# Patient Record
Sex: Female | Born: 1985 | Race: Black or African American | Hispanic: No | Marital: Single | Smoking: Never smoker
Health system: Southern US, Community
[De-identification: ages and names within clinical notes are randomized; demographics above are authoritative.]

## PROBLEM LIST (undated history)

## (undated) ENCOUNTER — Emergency Department (HOSPITAL_BASED_OUTPATIENT_CLINIC_OR_DEPARTMENT_OTHER): Admission: EM | Payer: 59 | Source: Home / Self Care

## (undated) DIAGNOSIS — R42 Dizziness and giddiness: Secondary | ICD-10-CM

---

## 2009-04-26 ENCOUNTER — Ambulatory Visit: Payer: Self-pay | Admitting: Radiology

## 2009-04-26 ENCOUNTER — Emergency Department (HOSPITAL_BASED_OUTPATIENT_CLINIC_OR_DEPARTMENT_OTHER): Admission: EM | Admit: 2009-04-26 | Discharge: 2009-04-26 | Payer: Self-pay | Admitting: Emergency Medicine

## 2010-06-20 ENCOUNTER — Emergency Department (HOSPITAL_BASED_OUTPATIENT_CLINIC_OR_DEPARTMENT_OTHER): Admission: EM | Admit: 2010-06-20 | Discharge: 2010-06-20 | Payer: Self-pay | Admitting: Emergency Medicine

## 2010-12-12 LAB — URINALYSIS, ROUTINE W REFLEX MICROSCOPIC
Bilirubin Urine: NEGATIVE
Glucose, UA: NEGATIVE mg/dL
Hgb urine dipstick: NEGATIVE
Specific Gravity, Urine: 1.027 (ref 1.005–1.030)
pH: 6 (ref 5.0–8.0)

## 2011-01-05 LAB — URINE MICROSCOPIC-ADD ON

## 2011-01-05 LAB — CBC
Hemoglobin: 12.8 g/dL (ref 12.0–15.0)
MCHC: 32.4 g/dL (ref 30.0–36.0)
RDW: 13.6 % (ref 11.5–15.5)

## 2011-01-05 LAB — URINALYSIS, ROUTINE W REFLEX MICROSCOPIC
Ketones, ur: 15 mg/dL — AB
Leukocytes, UA: NEGATIVE
Nitrite: NEGATIVE
Specific Gravity, Urine: 1.028 (ref 1.005–1.030)
pH: 5.5 (ref 5.0–8.0)

## 2011-01-05 LAB — DIFFERENTIAL
Basophils Absolute: 0.1 10*3/uL (ref 0.0–0.1)
Basophils Relative: 1 % (ref 0–1)
Eosinophils Relative: 1 % (ref 0–5)
Monocytes Absolute: 0.6 10*3/uL (ref 0.1–1.0)
Neutro Abs: 5.4 10*3/uL (ref 1.7–7.7)

## 2011-01-05 LAB — BASIC METABOLIC PANEL
BUN: 14 mg/dL (ref 6–23)
CO2: 25 mEq/L (ref 19–32)
Calcium: 9.5 mg/dL (ref 8.4–10.5)
Glucose, Bld: 78 mg/dL (ref 70–99)
Sodium: 142 mEq/L (ref 135–145)

## 2011-01-05 LAB — PREGNANCY, URINE: Preg Test, Ur: NEGATIVE

## 2011-09-03 ENCOUNTER — Emergency Department (HOSPITAL_BASED_OUTPATIENT_CLINIC_OR_DEPARTMENT_OTHER)
Admission: EM | Admit: 2011-09-03 | Discharge: 2011-09-03 | Disposition: A | Discharge status: Home / Self Care | Payer: 59 | Attending: Emergency Medicine | Admitting: Emergency Medicine

## 2011-09-03 ENCOUNTER — Encounter: Payer: Self-pay | Admitting: Emergency Medicine

## 2011-09-03 DIAGNOSIS — L299 Pruritus, unspecified: Secondary | ICD-10-CM

## 2011-09-03 MED ORDER — DIPHENHYDRAMINE HCL 50 MG/ML IJ SOLN
25.0000 mg | Freq: Once | INTRAMUSCULAR | Status: AC
  Administered 2011-09-03: 25 mg via INTRAVENOUS
  Filled 2011-09-03: qty 1

## 2011-09-03 MED ORDER — HYDROXYZINE HCL 25 MG PO TABS: 25.0000 mg | ORAL_TABLET | Freq: Four times a day (QID) | ORAL | Status: AC

## 2011-09-03 NOTE — ED Provider Notes (Signed)
History     CSN: [id number] Arrival date & time: 09/03/2011 10:39 PM   First MD Initiated Contact with Patient 09/03/11 2317      Chief Complaint  Patient presents with  . Allergic Reaction   HPI  patient states while she was at work she started developing itching all over. Patient states she does have history of eczema but has not really noticed any particular rash. 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 Patient has not had trouble like this at work before. She denies any shortness of breath or throat swelling. The itching is all over. It is described as moderate to severe. Scratching seems to alleviate it somewhat.   History reviewed. No pertinent past medical history. except eczema  History reviewed. No pertinent past surgical history.  No family history on file.  History  Substance Use Topics  . Smoking status: Never Smoker   . Smokeless tobacco: Not on file  . Alcohol Use: No    OB History    Grav Para Term Preterm Abortions TAB SAB Ect Mult Living                  Review of Systems  All other systems reviewed and are negative.    Allergies  Review of patient's allergies indicates no known allergies.  Home Medications  No current outpatient prescriptions on file.  BP 151/78  Pulse 93  Temp(Src) 97.9 F (36.6 C) (Oral)  Resp 18  SpO2 100%  Physical Exam  Nursing note and vitals reviewed. Constitutional: She appears well-developed and well-nourished. No distress.  HENT:  Head: Normocephalic and atraumatic.  Right Ear: External ear normal.  Left Ear: External ear normal.  Eyes: Conjunctivae are normal. Right eye exhibits no discharge. Left eye exhibits no discharge. No scleral icterus.  Neck: Neck supple. No tracheal deviation present.  Cardiovascular: Normal rate, regular rhythm and intact distal pulses.   Pulmonary/Chest: Effort normal and breath sounds normal.  No stridor. No respiratory distress. She has no wheezes. She has no rales.  Abdominal: Soft. Bowel sounds are normal.  Musculoskeletal: She exhibits no edema and no tenderness.  Neurological: She is alert. She has normal strength. No sensory deficit. Cranial nerve deficit:  no gross defecits noted. She exhibits normal muscle tone. She displays no seizure activity. Coordination normal.  Skin: Skin is warm and dry. No rash noted. No erythema.       Skin somewhat dry  Psychiatric: She has a normal mood and affect.    ED Course  Procedures (including critical care time)  Labs Reviewed - No data to display No results found.  MDM  Patient complains of (but there is no obvious rash. She does not recall getting in contact with any particular substance while at work. This not clear that this is an allergic type reaction. At this point there doesn't appear to be any evidence of an acute emergency medical condition. She'll be given an injection of Benadryl. I will discharge her home with a prescription for hydroxyzine.  Diagnosis #1 pruritus      Celene Kras, MD 09/03/11 2329

## 2011-09-03 NOTE — ED Notes (Signed)
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 No rash noted.

## 2011-11-14 ENCOUNTER — Encounter (HOSPITAL_BASED_OUTPATIENT_CLINIC_OR_DEPARTMENT_OTHER): Payer: Self-pay | Admitting: *Deleted

## 2011-11-14 ENCOUNTER — Emergency Department (HOSPITAL_BASED_OUTPATIENT_CLINIC_OR_DEPARTMENT_OTHER)
Admission: EM | Admit: 2011-11-14 | Discharge: 2011-11-14 | Disposition: A | Discharge status: Home / Self Care | Payer: PRIVATE HEALTH INSURANCE | Attending: Emergency Medicine | Admitting: Emergency Medicine

## 2011-11-14 DIAGNOSIS — R42 Dizziness and giddiness: Secondary | ICD-10-CM | POA: Insufficient documentation

## 2011-11-14 DIAGNOSIS — N390 Urinary tract infection, site not specified: Secondary | ICD-10-CM

## 2011-11-14 DIAGNOSIS — A5901 Trichomonal vulvovaginitis: Secondary | ICD-10-CM | POA: Insufficient documentation

## 2011-11-14 LAB — BASIC METABOLIC PANEL
BUN: 16 mg/dL (ref 6–23)
Calcium: 9.4 mg/dL (ref 8.4–10.5)
GFR calc non Af Amer: >90 mL/min (ref >90)
Glucose, Bld: 90 mg/dL (ref 70–99)

## 2011-11-14 LAB — CBC
MCH: 25.5 pg — ABNORMAL LOW (ref 26.0–34.0)
MCHC: 32 g/dL (ref 30.0–36.0)
Platelets: 297 10*3/uL (ref 150–400)

## 2011-11-14 LAB — URINE MICROSCOPIC-ADD ON

## 2011-11-14 LAB — URINALYSIS, ROUTINE W REFLEX MICROSCOPIC
Bilirubin Urine: NEGATIVE
Ketones, ur: NEGATIVE mg/dL
Nitrite: NEGATIVE
Specific Gravity, Urine: 1.024 (ref 1.005–1.030)
Urobilinogen, UA: 1 mg/dL (ref 0.0–1.0)

## 2011-11-14 MED ORDER — MECLIZINE HCL 25 MG PO TABS
25.0000 mg | ORAL_TABLET | Freq: Once | ORAL | Status: AC
  Administered 2011-11-14: 25 mg via ORAL
  Filled 2011-11-14: qty 1

## 2011-11-14 MED ORDER — MECLIZINE HCL 25 MG PO TABS: 25.0000 mg | ORAL_TABLET | Freq: Three times a day (TID) | ORAL | Status: AC | PRN

## 2011-11-14 MED ORDER — NITROFURANTOIN MONOHYD MACRO 100 MG PO CAPS: 100.0000 mg | ORAL_CAPSULE | Freq: Two times a day (BID) | ORAL | Status: AC

## 2011-11-14 MED ORDER — METRONIDAZOLE 500 MG PO TABS
2000.0000 mg | ORAL_TABLET | Freq: Once | ORAL | Status: AC
  Administered 2011-11-14: 2000 mg via ORAL
  Filled 2011-11-14: qty 4

## 2011-11-14 NOTE — ED Notes (Signed)
Last night while laying down she felt like the room was spinning. She had nausea with the dizziness. Had the same thing happen while at work last week. Woke this am and symptoms were gone but as the day progressed they came back.

## 2011-11-14 NOTE — ED Provider Notes (Signed)
History     CSN: [id number]  Arrival date & time 11/14/11  1631   First MD Initiated Contact with Patient 11/14/11 1706      Chief Complaint  Patient presents with  . Dizziness    (Consider location/radiation/quality/duration/timing/severity/associated sxs/prior treatment) HPI Comments: Pt states that yesterday she had multiple episodes of dizziness as in the room spinning and then in happened again today:pt states that it happens when she turns her head to fast or if she changes positions to fast:pt states that she had some vomiting when the spinning starts:pt denies headache,fever, visual changes, numbness or weakness  The history is provided by the patient. No language interpreter was used.    History reviewed. No pertinent past medical history.  History reviewed. No pertinent past surgical history.  No family history on file.  History  Substance Use Topics  . Smoking status: Never Smoker   . Smokeless tobacco: Not on file  . Alcohol Use: No    OB History    Grav Para Term Preterm Abortions TAB SAB Ect Mult Living                  Review of Systems  All other systems reviewed and are negative.    Allergies  Review of patient's allergies indicates no known allergies.  Home Medications  No current outpatient prescriptions on file.  BP 129/80  Pulse 95  Temp(Src) 98.4 F (36.9 C) (Oral)  Resp 20  SpO2 100%  Physical Exam  Nursing note and vitals reviewed. Constitutional: She is oriented to person, place, and time. She appears well-developed and well-nourished.  HENT:  Head: Normocephalic and atraumatic.  Eyes: Conjunctivae and EOM are normal.  Neck: Neck supple.  Cardiovascular: Normal rate and regular rhythm.   Pulmonary/Chest: Effort normal and breath sounds normal.  Abdominal: Bowel sounds are normal.  Musculoskeletal: Normal range of motion.  Neurological: She is alert and oriented to person, place, and time.  Skin: Skin is warm and dry.    Psychiatric: She has a normal mood and affect.    ED Course  Procedures (including critical care time)  Labs Reviewed  URINALYSIS, ROUTINE W REFLEX MICROSCOPIC - Abnormal; Notable for the following:    APPearance CLOUDY (*)    Leukocytes, UA MODERATE (*)    All other components within normal limits  CBC - Abnormal; Notable for the following:    WBC 12.2 (*)    MCH 25.5 (*)    All other components within normal limits  URINE MICROSCOPIC-ADD ON - Abnormal; Notable for the following:    Squamous Epithelial / LPF MANY (*)    Bacteria, UA MANY (*)    All other components within normal limits  PREGNANCY, URINE  BASIC METABOLIC PANEL   No results found.   1. UTI (lower urinary tract infection)   2. Trichomonal vaginitis   3. Vertigo       MDM  Pt is okay to follow up for std check:pt is feeling better after the meclizine:pt is ambulatory without any problem        Teressa Lower, NP 11/14/11 1837

## 2011-11-14 NOTE — ED Notes (Signed)
Pt. Reports no vomiting, no nausea at present at this time.

## 2011-11-15 NOTE — ED Provider Notes (Signed)
Medical screening examination/treatment/procedure(s) were performed by non-physician practitioner and as supervising physician I was immediately available for consultation/collaboration.   Glynn Octave, MD 11/15/11 [phone]

## 2012-01-11 ENCOUNTER — Emergency Department (HOSPITAL_BASED_OUTPATIENT_CLINIC_OR_DEPARTMENT_OTHER)
Admission: EM | Admit: 2012-01-11 | Discharge: 2012-01-11 | Disposition: A | Discharge status: Home / Self Care | Payer: 59 | Attending: Emergency Medicine | Admitting: Emergency Medicine

## 2012-01-11 ENCOUNTER — Encounter (HOSPITAL_BASED_OUTPATIENT_CLINIC_OR_DEPARTMENT_OTHER): Payer: Self-pay | Admitting: Emergency Medicine

## 2012-01-11 DIAGNOSIS — S39011A Strain of muscle, fascia and tendon of abdomen, initial encounter: Secondary | ICD-10-CM

## 2012-01-11 DIAGNOSIS — R1013 Epigastric pain: Secondary | ICD-10-CM | POA: Insufficient documentation

## 2012-01-11 DIAGNOSIS — L988 Other specified disorders of the skin and subcutaneous tissue: Secondary | ICD-10-CM | POA: Insufficient documentation

## 2012-01-11 DIAGNOSIS — S90829A Blister (nonthermal), unspecified foot, initial encounter: Secondary | ICD-10-CM

## 2012-01-11 MED ORDER — IBUPROFEN 800 MG PO TABS
800.0000 mg | ORAL_TABLET | Freq: Once | ORAL | Status: AC
  Administered 2012-01-11: 800 mg via ORAL
  Filled 2012-01-11: qty 1

## 2012-01-11 MED ORDER — DIPHENHYDRAMINE HCL 25 MG PO CAPS
25.0000 mg | ORAL_CAPSULE | Freq: Once | ORAL | Status: AC
  Administered 2012-01-11: 25 mg via ORAL
  Filled 2012-01-11 (×2): qty 1

## 2012-01-11 NOTE — Discharge Instructions (Signed)
Blisters Blisters are fluid-filled sacs that form within the skin. Common causes of blistering are friction, burns, and exposure to irritating chemicals. The fluid in the blister protects the underlying damaged skin. Most of the time it is not recommended that you open blisters. When a blister is opened, there is an increased chance for infection. Usually, a blister will open on its own. They then dry up and peel off within 10 days. If the blister is tense and uncomfortable (painful) the fluid may be drained. If it is drained the roof of the blister should be left intact. The draining should only be done by a medical professional under aseptic conditions. Poorly fitting shoes and boots can cause blisters by being too tight or too loose. Wearing extra socks or using tape, bandages, or pads over the blister-prone area helps prevent the problem by reducing friction. Blisters heal more slowly if you have diabetes or if you have problems with your circulation. You need to be careful about medical follow-up to prevent infection. HOME CARE INSTRUCTIONS  Protect areas where blisters have formed until the skin is healed. Use a special bandage with a hole cut in the middle around the blister. This reduces pressure and friction. When the blister breaks, trim off the loose skin and keep the area clean by washing it with soap daily. Soaking the blister or broken-open blister with diluted vinegar twice daily for 15 minutes will dry it up and speed the healing. Use 3 tablespoons of white vinegar per quart of water (45 mL white vinegar per liter of water). An antibiotic ointment and a bandage can be used to cover the area after soaking.  SEEK MEDICAL CARE IF:   You develop increased redness, pain, swelling, or drainage in the blistered area.   You develop a pus-like discharge from the blistered area, chills, or a fever.  MAKE SURE YOU:   Understand these instructions.   Will watch your condition.   Will get help  right away if you are not doing well or get worse.  Document Released: 10/23/2004 Document Revised: 09/04/2011 Document Reviewed: 09/20/2008 [facility] Patient Information 2012 [place], [place].Muscle Strain A muscle strain, or pulled muscle, occurs when a muscle is over-stretched. A small number of muscle fibers may also be torn. This is especially common in athletes. This happens when a sudden violent force placed on a muscle pushes it past its capacity. Usually, recovery from a pulled muscle takes 1 to 2 weeks. But complete healing will take 5 to 6 weeks. There are millions of muscle fibers. Following injury, your body will usually return to normal quickly. HOME CARE INSTRUCTIONS   While awake, apply ice to the sore muscle for 15 to 20 minutes each hour for the first 2 days. Put ice in a plastic bag and place a towel between the bag of ice and your skin.   Do not use the pulled muscle for several days. Do not use the muscle if you have pain.   You may wrap the injured area with an elastic bandage for comfort. Be careful not to bind it too tightly. This may interfere with blood circulation.   Only take over-the-counter or prescription medicines for pain, discomfort, or fever as directed by your caregiver. Do not use aspirin as this will increase bleeding (bruising) at injury site.   Warming up before exercise helps prevent muscle strains.  SEEK MEDICAL CARE IF:  There is increased pain or swelling in the affected area. MAKE SURE YOU:  Understand these instructions.   Will watch your condition.   Will get help right away if you are not doing well or get worse.  Document Released: 09/15/2005 Document Revised: 09/04/2011 Document Reviewed: 04/14/2007 [facility] Patient Information 2012 [place], [place].

## 2012-01-11 NOTE — ED Provider Notes (Signed)
History     CSN: [id number]  Arrival date & time 01/11/12  4696   First MD Initiated Contact with Patient 01/11/12 [phone]      Chief Complaint  Patient presents with  . Insect Bite  . Abdominal Pain    (Consider location/radiation/quality/duration/timing/severity/associated sxs/prior treatment) HPI Rash- Patient noted stinging to left foot at work Friday then Saturday morning she noted bumps on top of left foot.  Painless, pruritic, no drainage, no redness.  Patient had epigastric pain beginning yesterday.  Pain is constant tenderness, squeezing.  Taking po without problem, no nausea, vomiting, or fever,  She felt like she might have had some chills yesterday.  No vaginal discharge, no uti symptoms.  Menses regular, lmp 2 weeks ago, normal. No birth control, denies sexual activity for several months.  History reviewed. No pertinent past medical history.  History reviewed. No pertinent past surgical history.  No family history on file.  History  Substance Use Topics  . Smoking status: Never Smoker   . Smokeless tobacco: Not on file  . Alcohol Use: No    OB History    Grav Para Term Preterm Abortions TAB SAB Ect Mult Living                  Review of Systems  All other systems reviewed and are negative.    Allergies  Review of patient's allergies indicates no known allergies.  Home Medications  No current outpatient prescriptions on file.  BP 138/79  Pulse 77  Temp(Src) 98 F (36.7 C) (Oral)  Resp 20  Ht 5\' 8"  (1.727 m)  Wt 230 lb (104.327 kg)  BMI 34.97 kg/m2  SpO2 100%  LMP 12/28/2011  Physical Exam  Nursing note and vitals reviewed. Constitutional: She appears well-developed and well-nourished.  HENT:  Head: Normocephalic and atraumatic.  Eyes: Conjunctivae and EOM are normal. Pupils are equal, round, and reactive to light.  Neck: Normal range of motion. Neck supple.  Cardiovascular: Normal rate, regular rhythm, normal heart sounds and intact distal  pulses.   Pulmonary/Chest: Effort normal and breath sounds normal.  Abdominal: Soft. Bowel sounds are normal. She exhibits distension.       Mild epigastric tenderness with sitting up or movement or palpation.    Musculoskeletal: Normal range of motion.  Neurological: She is alert.  Skin: Skin is warm and dry.       Two bulla top of left foot no surrounding erythem  Psychiatric: She has a normal mood and affect. Thought content normal.    ED Course  Procedures (including critical care time)  Labs Reviewed - No data to display No results found.   No diagnosis found.    MDM  Foot rash. The lie on her feet were cleaned with alcohol. A 22-gauge needle was used to unroofed the area and there is serous fluid and with no evidence of pus noted. Abdominal pain patient has slight tenderness as pain when sitting up or moving. She has had no nausea, vomiting, diarrhea, anorexia, or fever. This seems to be secondary to muscles and the abdominal wall increasing only with flexion of the trunk. She is advised to return if she has any worsening of symptoms or begins having vomiting or fever.      Hilario Quarry, MD 01/11/12 [phone]

## 2012-01-11 NOTE — ED Notes (Signed)
Pt c/o "bite" to top of LT foot x 1 day (2 small blister-like areas noted); also c/o upper middle abd pain x 2 days- denies NVD

## 2012-04-07 ENCOUNTER — Emergency Department (HOSPITAL_BASED_OUTPATIENT_CLINIC_OR_DEPARTMENT_OTHER)
Admission: EM | Admit: 2012-04-07 | Discharge: 2012-04-07 | Disposition: A | Discharge status: Home / Self Care | Payer: 59 | Attending: Emergency Medicine | Admitting: Emergency Medicine

## 2012-04-07 ENCOUNTER — Encounter (HOSPITAL_BASED_OUTPATIENT_CLINIC_OR_DEPARTMENT_OTHER): Payer: Self-pay | Admitting: *Deleted

## 2012-04-07 DIAGNOSIS — R42 Dizziness and giddiness: Secondary | ICD-10-CM | POA: Insufficient documentation

## 2012-04-07 HISTORY — DX: Dizziness and giddiness: R42

## 2012-04-07 MED ORDER — DIAZEPAM 5 MG PO TABS
5.0000 mg | ORAL_TABLET | Freq: Once | ORAL | Status: AC
  Administered 2012-04-07: 5 mg via ORAL
  Filled 2012-04-07: qty 1

## 2012-04-07 MED ORDER — DIAZEPAM 5 MG PO TABS: 5.0000 mg | ORAL_TABLET | Freq: Four times a day (QID) | ORAL | Status: AC | PRN

## 2012-04-07 NOTE — ED Notes (Signed)
Pt reports "vertigo acting up" since this afternoon. C/o dizziness. Took her grandmother's Meclizine PO @ 7pm tonight bc she couldn't find her own pill. Pt reports minimal relief.

## 2012-04-07 NOTE — ED Provider Notes (Signed)
History     CSN: [id number]  Arrival date & time 04/07/12  2145   First MD Initiated Contact with Patient 04/07/12 2331      Chief Complaint  Patient presents with  . Dizziness    (Consider location/radiation/quality/duration/timing/severity/associated sxs/prior treatment) HPI Is a 26 year old black female with a history of vertigo. She had another episode of vertigo beginning this afternoon. The symptoms were moderate at that time with only mild now. She is characterized by a sensation of surrounding spinning. It is worse with movement of her head. She took some meclizine but 7 PM with some improvement. She has had nausea but no vomiting.  Past Medical History  Diagnosis Date  . Vertigo     History reviewed. No pertinent past surgical history.  No family history on file.  History  Substance Use Topics  . Smoking status: Never Smoker   . Smokeless tobacco: Not on file  . Alcohol Use: No    OB History    Grav Para Term Preterm Abortions TAB SAB Ect Mult Living                  Review of Systems  All other systems reviewed and are negative.    Allergies  Review of patient's allergies indicates no known allergies.  Home Medications   Current Outpatient Rx  Name Route Sig Dispense Refill  . MECLIZINE HCL 25 MG PO TABS Oral Take 25 mg by mouth 3 (three) times daily as needed. Patient took this medication for dizziness.      BP 148/78  Pulse 91  Temp 98.4 F (36.9 C) (Oral)  Resp 18  Ht 5\' 7"  (1.702 m)  Wt 230 lb (104.327 kg)  BMI 36.02 kg/m2  SpO2 100%  LMP 04/07/2012  Physical Exam General: Well-developed, well-nourished female in no acute distress; appearance consistent with age of record HENT: normocephalic, atraumatic; TMs normal Eyes: pupils equal round and reactive to light; extraocular muscles intact; no nystagmus Neck: supple Heart: regular rate and rhythm; no murmurs, rubs or gallops Lungs: clear to auscultation bilaterally Abdomen: soft;  nondistended; nontender Extremities: No deformity; full range of motion Neurologic: Awake, alert and oriented; motor function intact in all extremities and symmetric; no facial droop Skin: Warm and dry Psychiatric: Normal mood and affect    ED Course  Procedures (including critical care time)     MDM          Hanley Seamen, MD 04/07/12 2337

## 2012-04-07 NOTE — ED Notes (Signed)
MD at bedside. 

## 2012-07-10 ENCOUNTER — Emergency Department (HOSPITAL_BASED_OUTPATIENT_CLINIC_OR_DEPARTMENT_OTHER)
Admission: EM | Admit: 2012-07-10 | Discharge: 2012-07-10 | Disposition: A | Discharge status: Home / Self Care | Payer: 59 | Attending: Emergency Medicine | Admitting: Emergency Medicine

## 2012-07-10 ENCOUNTER — Encounter (HOSPITAL_BASED_OUTPATIENT_CLINIC_OR_DEPARTMENT_OTHER): Payer: Self-pay | Admitting: *Deleted

## 2012-07-10 ENCOUNTER — Emergency Department (HOSPITAL_BASED_OUTPATIENT_CLINIC_OR_DEPARTMENT_OTHER): Payer: 59

## 2012-07-10 DIAGNOSIS — M542 Cervicalgia: Secondary | ICD-10-CM | POA: Insufficient documentation

## 2012-07-10 DIAGNOSIS — M25579 Pain in unspecified ankle and joints of unspecified foot: Secondary | ICD-10-CM | POA: Insufficient documentation

## 2012-07-10 DIAGNOSIS — T148XXA Other injury of unspecified body region, initial encounter: Secondary | ICD-10-CM

## 2012-07-10 DIAGNOSIS — Y92410 Unspecified street and highway as the place of occurrence of the external cause: Secondary | ICD-10-CM | POA: Insufficient documentation

## 2012-07-10 MED ORDER — CYCLOBENZAPRINE HCL 10 MG PO TABS
10.0000 mg | ORAL_TABLET | Freq: Once | ORAL | Status: AC
  Administered 2012-07-10: 10 mg via ORAL
  Filled 2012-07-10: qty 1

## 2012-07-10 MED ORDER — CYCLOBENZAPRINE HCL 10 MG PO TABS: 10.0000 mg | ORAL_TABLET | Freq: Two times a day (BID) | ORAL | Status: DC | PRN
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
## 2012-09-25 ENCOUNTER — Encounter (HOSPITAL_BASED_OUTPATIENT_CLINIC_OR_DEPARTMENT_OTHER): Payer: Self-pay | Admitting: *Deleted

## 2012-09-25 ENCOUNTER — Emergency Department (HOSPITAL_BASED_OUTPATIENT_CLINIC_OR_DEPARTMENT_OTHER)
Admission: EM | Admit: 2012-09-25 | Discharge: 2012-09-25 | Disposition: A | Discharge status: Home / Self Care | Payer: 59 | Attending: Emergency Medicine | Admitting: Emergency Medicine

## 2012-09-25 DIAGNOSIS — O219 Vomiting of pregnancy, unspecified: Secondary | ICD-10-CM | POA: Insufficient documentation

## 2012-09-25 DIAGNOSIS — Z3201 Encounter for pregnancy test, result positive: Secondary | ICD-10-CM | POA: Insufficient documentation

## 2012-09-25 DIAGNOSIS — R197 Diarrhea, unspecified: Secondary | ICD-10-CM | POA: Insufficient documentation

## 2012-09-25 DIAGNOSIS — Z3490 Encounter for supervision of normal pregnancy, unspecified, unspecified trimester: Secondary | ICD-10-CM

## 2012-09-25 DIAGNOSIS — R1110 Vomiting, unspecified: Secondary | ICD-10-CM

## 2012-09-25 DIAGNOSIS — O9989 Other specified diseases and conditions complicating pregnancy, childbirth and the puerperium: Secondary | ICD-10-CM | POA: Insufficient documentation

## 2012-09-25 LAB — URINALYSIS, ROUTINE W REFLEX MICROSCOPIC
Bilirubin Urine: NEGATIVE
Glucose, UA: NEGATIVE mg/dL
Ketones, ur: NEGATIVE mg/dL
Leukocytes, UA: NEGATIVE
Nitrite: NEGATIVE
Protein, ur: NEGATIVE mg/dL

## 2012-09-25 MED ORDER — PRENATAL 27-0.8 MG PO TABS: 1.0000 | ORAL_TABLET | Freq: Every day | ORAL | Status: DC

## 2012-09-25 MED ORDER — ONDANSETRON HCL 4 MG PO TABS: 4.0000 mg | ORAL_TABLET | Freq: Four times a day (QID) | ORAL | Status: DC

## 2012-09-25 MED ORDER — ONDANSETRON 4 MG PO TBDP
4.0000 mg | ORAL_TABLET | Freq: Once | ORAL | Status: AC
  Administered 2012-09-25: 4 mg via ORAL
  Filled 2012-09-25: qty 1

## 2012-09-25 NOTE — ED Provider Notes (Signed)
History   This chart was scribed for Ethelda Chick, MD scribed by Magnus Sinning. The patient was seen in room MH09/[facility]09 at 18:04   CSN: [id number]  Arrival date & time 09/25/12  1540    Chief Complaint  Patient presents with  . Emesis    (Consider location/radiation/quality/duration/timing/severity/associated sxs/prior treatment) HPI Comments: Claudia Banks is a 26 y.o. female who presents to the Emergency Department complaining of constant moderate emesis with associated nausea onset 2.5 hours ago. Pt notes diarrhea at home before emesis onset. Pt reports 2-3 episodes of emesis today, and she denies abd pain, or vaginal bleeding.   LNMP:08/07/12  G:1 P:0 A:0   Patient is a 26 y.o. female presenting with vomiting. The history is provided by the patient. No language interpreter was used.  Emesis  This is a new problem. The current episode started 3 to 5 hours ago. The problem occurs 2 to 4 times per day. The problem has not changed since onset.The emesis has an appearance of stomach contents. There has been no fever. Associated symptoms include diarrhea. Pertinent negatives include no abdominal pain and no fever.    Past Medical History  Diagnosis Date  . Vertigo     History reviewed. No pertinent past surgical history.  History reviewed. No pertinent family history.  History  Substance Use Topics  . Smoking status: Never Smoker   . Smokeless tobacco: Not on file  . Alcohol Use: No   Review of Systems  Constitutional: Negative for fever.  Gastrointestinal: Positive for nausea, vomiting and diarrhea. Negative for abdominal pain.  All other systems reviewed and are negative.    Allergies  Review of patient's allergies indicates no known allergies.  Home Medications   Current Outpatient Rx  Name  Route  Sig  Dispense  Refill  . CYCLOBENZAPRINE HCL 10 MG PO TABS   Oral   Take 1 tablet (10 mg total) by mouth 2 (two) times daily as needed for muscle spasms.  20 tablet   0   . MECLIZINE HCL 25 MG PO TABS   Oral   Take 25 mg by mouth 3 (three) times daily as needed. Patient took this medication for dizziness.         Marland Kitchen ONDANSETRON HCL 4 MG PO TABS   Oral   Take 1 tablet (4 mg total) by mouth every 6 (six) hours.   12 tablet   0   . PRENATAL 27-0.8 MG PO TABS   Oral   Take 1 tablet by mouth daily.   30 each   0     BP 140/75  Pulse 92  Temp 98.4 F (36.9 C) (Oral)  Resp 16  Ht 5\' 8"  (1.727 m)  Wt 230 lb (104.327 kg)  BMI 34.97 kg/m2  SpO2 100%  LMP 08/07/2012  Physical Exam  Nursing note and vitals reviewed. Constitutional: She is oriented to person, place, and time. She appears well-developed and well-nourished. No distress.  HENT:  Head: Normocephalic and atraumatic.  Eyes: Conjunctivae normal and EOM are normal.  Neck: Neck supple. No tracheal deviation present.  Cardiovascular: Normal rate, regular rhythm and normal heart sounds.   Pulmonary/Chest: Effort normal. No respiratory distress. She has no wheezes. She has no rales.  Abdominal: Soft. Bowel sounds are normal. She exhibits no distension. There is no tenderness.  Musculoskeletal: Normal range of motion. She exhibits no edema.  Neurological: She is alert and oriented to person, place, and time. No sensory deficit.  Skin: Skin  is warm and dry.  Psychiatric: She has a normal mood and affect. Her behavior is normal.    ED Course  Procedures (including critical care time) DIAGNOSTIC STUDIES: Oxygen Saturation is 100% on room air, normal by my interpretation.    COORDINATION OF CARE: 18:06: physical exam performed Labs Reviewed  PREGNANCY, URINE - Abnormal; Notable for the following:    Preg Test, Ur POSITIVE (*)     All other components within normal limits  URINALYSIS, ROUTINE W REFLEX MICROSCOPIC   No results found.   1. Vomiting   2. Pregnancy       MDM  Pt presents with c/o nausea/vomiting this morning.  She was diagnosed with pregnancy in  the ED.  She has no abdominal pain, no vaginal bleeding.  Pt given zofran ODT and tolerated fluid challenge in the ED.  Discharged with strict return precautions.  Pt agreeable with plan.   I personally performed the services described in this documentation, which was scribed in my presence. The recorded information has been reviewed and is accurate.         Ethelda Chick, MD 09/26/12 [phone]

## 2012-09-25 NOTE — ED Notes (Signed)
N/V since this am

## 2015-06-01 ENCOUNTER — Encounter (HOSPITAL_BASED_OUTPATIENT_CLINIC_OR_DEPARTMENT_OTHER): Payer: Self-pay

## 2015-06-01 ENCOUNTER — Emergency Department (HOSPITAL_BASED_OUTPATIENT_CLINIC_OR_DEPARTMENT_OTHER)
Admission: EM | Admit: 2015-06-01 | Discharge: 2015-06-01 | Disposition: A | Discharge status: Home / Self Care | Payer: Medicaid Other | Attending: Emergency Medicine | Admitting: Emergency Medicine

## 2015-06-01 DIAGNOSIS — O9989 Other specified diseases and conditions complicating pregnancy, childbirth and the puerperium: Secondary | ICD-10-CM | POA: Insufficient documentation

## 2015-06-01 DIAGNOSIS — O210 Mild hyperemesis gravidarum: Secondary | ICD-10-CM | POA: Insufficient documentation

## 2015-06-01 DIAGNOSIS — R51 Headache: Secondary | ICD-10-CM | POA: Insufficient documentation

## 2015-06-01 DIAGNOSIS — R519 Headache, unspecified: Secondary | ICD-10-CM

## 2015-06-01 DIAGNOSIS — Z3A01 Less than 8 weeks gestation of pregnancy: Secondary | ICD-10-CM | POA: Diagnosis not present

## 2015-06-01 LAB — URINALYSIS, ROUTINE W REFLEX MICROSCOPIC
Bilirubin Urine: NEGATIVE
GLUCOSE, UA: NEGATIVE mg/dL
HGB URINE DIPSTICK: NEGATIVE
KETONES UR: 15 mg/dL — AB
Nitrite: NEGATIVE
PH: 6.5 (ref 5.0–8.0)
PROTEIN: NEGATIVE mg/dL
Specific Gravity, Urine: 1.027 (ref 1.005–1.030)
Urobilinogen, UA: 1 mg/dL (ref 0.0–1.0)

## 2015-06-01 LAB — PREGNANCY, URINE: Preg Test, Ur: POSITIVE — AB

## 2015-06-01 LAB — URINE MICROSCOPIC-ADD ON

## 2015-06-01 MED ORDER — METOCLOPRAMIDE HCL 10 MG PO TABS: 10.0000 mg | ORAL_TABLET | Freq: Four times a day (QID) | ORAL | Status: DC | PRN

## 2015-06-01 MED ORDER — NITROFURANTOIN MONOHYD MACRO 100 MG PO CAPS: 100.0000 mg | ORAL_CAPSULE | Freq: Two times a day (BID) | ORAL | Status: DC

## 2015-06-01 NOTE — ED Notes (Signed)
HA x 1 week-pt steady gait-NAD

## 2015-06-01 NOTE — ED Provider Notes (Signed)
CSN: [id number]     Arrival date & time 06/01/15  1408 History   First MD Initiated Contact with Patient 06/01/15 1419     Chief Complaint  Patient presents with  . Headache     (Consider location/radiation/quality/duration/timing/severity/associated sxs/prior Treatment) Patient is a 29 y.o. female presenting with headaches.  Headache Pain location:  Generalized Quality:  Dull (pounding) Radiates to:  Does not radiate Onset quality:  Gradual Duration:  1 week Timing:  Constant Chronicity:  Recurrent Similar to prior headaches: yes   Context: bright light   Relieved by:  Nothing Worsened by:  Nothing Associated symptoms: nausea and vomiting   Associated symptoms: no abdominal pain, no numbness and no visual change     Past Medical History  Diagnosis Date  . Vertigo    History reviewed. No pertinent past surgical history. No family history on file. Social History  Substance Use Topics  . Smoking status: Never Smoker   . Smokeless tobacco: None  . Alcohol Use: No   OB History    No data available     Review of Systems  Gastrointestinal: Positive for nausea and vomiting. Negative for abdominal pain.  Neurological: Positive for headaches. Negative for numbness.  All other systems reviewed and are negative.     Allergies  Review of patient's allergies indicates no known allergies.  Home Medications   Prior to Admission medications   Medication Sig Start Date End Date Taking? Authorizing Provider  metoCLOPramide (REGLAN) 10 MG tablet Take 1 tablet (10 mg total) by mouth every 6 (six) hours as needed for nausea (nausea/headache). 06/01/15   Mirian Mo, MD  nitrofurantoin, macrocrystal-monohydrate, (MACROBID) 100 MG capsule Take 1 capsule (100 mg total) by mouth 2 (two) times daily. X 7 days 06/01/15   Mirian Mo, MD   BP 125/65 mmHg  Pulse 93  Temp(Src) 98.6 F (37 C) (Oral)  Resp 18  Ht  (1.727 m)  Wt 245 lb (111.131 kg)  BMI 37.26 kg/m2  SpO2  100%  LMP 04/23/2015 Physical Exam  Constitutional: She is oriented to person, place, and time. She appears well-developed and well-nourished.  HENT:  Head: Normocephalic and atraumatic.  Right Ear: External ear normal.  Left Ear: External ear normal.  Eyes: Conjunctivae and EOM are normal. Pupils are equal, round, and reactive to light.  Neck: Normal range of motion. Neck supple.  Cardiovascular: Normal rate, regular rhythm, normal heart sounds and intact distal pulses.   Pulmonary/Chest: Effort normal and breath sounds normal.  Abdominal: Soft. Bowel sounds are normal. There is no tenderness.  Musculoskeletal: Normal range of motion.  Neurological: She is alert and oriented to person, place, and time. She has normal strength and normal reflexes. No cranial nerve deficit or sensory deficit. Coordination normal. GCS eye subscore is 4. GCS verbal subscore is 5. GCS motor subscore is 6.  Skin: Skin is warm and dry.  Vitals reviewed.   ED Course  Procedures (including critical care time) Labs Review Labs Reviewed  URINALYSIS, ROUTINE W REFLEX MICROSCOPIC (NOT AT [facility]) - Abnormal; Notable for the following:    APPearance TURBID (*)    Ketones, ur 15 (*)    Leukocytes, UA LARGE (*)    All other components within normal limits  PREGNANCY, URINE - Abnormal; Notable for the following:    Preg Test, Ur POSITIVE (*)    All other components within normal limits  URINE MICROSCOPIC-ADD ON - Abnormal; Notable for the following:    Squamous Epithelial /  LPF MANY (*)    Bacteria, UA MANY (*)    All other components within normal limits    Imaging Review No results found. I have personally reviewed and evaluated these images and lab results as part of my medical decision-making.   EKG Interpretation None      MDM   Final diagnoses:  Acute nonintractable headache, unspecified headache type    29 y.o. female with pertinent PMH of migraines presents with recurrent ha.  She states this  is identical to prior migraines.  No neuro deficits.  LMP 2 months ago.  UA checked and positive for pregnancy, bacteriuria.  Discussed results with pt.  No abd pain or other factors concerning for ectopic.  Given prescription for reglan, macrobid.  Ha likely recurrent migraine.  Would treat here but pt is driving.   I have reviewed all laboratory and imaging studies if ordered as above  1. Acute nonintractable headache, unspecified headache type         Mirian Mo, MD 06/01/15 [phone]

## 2015-06-01 NOTE — Discharge Instructions (Signed)
General Headache Without Cause A headache is pain or discomfort felt around the head or neck area. The specific cause of a headache may not be found. There are many causes and types of headaches. A few common ones are:  Tension headaches.  Migraine headaches.  Cluster headaches.  Chronic daily headaches. HOME CARE INSTRUCTIONS   Keep all follow-up appointments with your caregiver or any specialist referral.  Only take over-the-counter or prescription medicines for pain or discomfort as directed by your caregiver.  Lie down in a dark, quiet room when you have a headache.  Keep a headache journal to find out what may trigger your migraine headaches. For example, write down:  What you eat and drink.  How much sleep you get.  Any change to your diet or medicines.  Try massage or other relaxation techniques.  Put ice packs or heat on the head and neck. Use these 3 to 4 times per day for 15 to 20 minutes each time, or as needed.  Limit stress.  Sit up straight, and do not tense your muscles.  Quit smoking if you smoke.  Limit alcohol use.  Decrease the amount of caffeine you drink, or stop drinking caffeine.  Eat and sleep on a regular schedule.  Get 7 to 9 hours of sleep, or as recommended by your caregiver.  Keep lights dim if bright lights bother you and make your headaches worse. SEEK MEDICAL CARE IF:   You have problems with the medicines you were prescribed.  Your medicines are not working.  You have a change from the usual headache.  You have nausea or vomiting. SEEK IMMEDIATE MEDICAL CARE IF:   Your headache becomes severe.  You have a fever.  You have a stiff neck.  You have loss of vision.  You have muscular weakness or loss of muscle control.  You start losing your balance or have trouble walking.  You feel faint or pass out.  You have severe symptoms that are different from your first symptoms. MAKE SURE YOU:   Understand these  instructions.  Will watch your condition.  Will get help right away if you are not doing well or get worse. Document Released: 09/15/2005 Document Revised: 12/08/2011 Document Reviewed: 10/01/2011 [facility] Patient Information 2015 [place], [place]. This information is not intended to replace advice given to you by your health care provider. Make sure you discuss any questions you have with your health care provider. Asymptomatic Bacteriuria Asymptomatic bacteriuria is the presence of a large number of bacteria in your urine without the usual symptoms of burning or frequent urination. The following conditions increase the risk of asymptomatic bacteriuria:  Diabetes mellitus.  Advanced age.  Pregnancy in the first trimester.  Kidney stones.  Kidney transplants.  Leaky kidney tube valve in young children (reflux). Treatment for this condition is not needed in most people and can lead to other problems such as too much yeast and growth of resistant bacteria. However, some people, such as pregnant women, do need treatment to prevent kidney infection. Asymptomatic bacteriuria in pregnancy is also associated with fetal growth restriction, premature labor, and newborn death. HOME CARE INSTRUCTIONS Monitor your condition for any changes. The following actions may help to relieve any discomfort you are feeling:  Drink enough water and fluids to keep your urine clear or pale yellow. Go to the bathroom more often to keep your bladder empty.  Keep the area around your vagina and rectum clean. Wipe yourself from front to back after urinating.  SEEK IMMEDIATE MEDICAL CARE IF:  You develop signs of an infection such as:  Burning with urination.  Frequency of voiding.  Back pain.  Fever.  You have blood in the urine.  You develop a fever. MAKE SURE YOU:  Understand these instructions.  Will watch your condition.  Will get help right away if you are not doing well or get worse. Document  Released: 09/15/2005 Document Revised: 01/30/2014 Document Reviewed: 03/07/2013 [facility] Patient Information 2015 [place], [place]. This information is not intended to replace advice given to you by your health care provider. Make sure you discuss any questions you have with your health care provider.

## 2015-09-18 ENCOUNTER — Encounter (HOSPITAL_BASED_OUTPATIENT_CLINIC_OR_DEPARTMENT_OTHER): Payer: Self-pay

## 2015-09-18 ENCOUNTER — Emergency Department (HOSPITAL_BASED_OUTPATIENT_CLINIC_OR_DEPARTMENT_OTHER)
Admission: EM | Admit: 2015-09-18 | Discharge: 2015-09-18 | Disposition: A | Discharge status: Home / Self Care | Payer: Worker's Compensation | Attending: Emergency Medicine | Admitting: Emergency Medicine

## 2015-09-18 DIAGNOSIS — S0993XA Unspecified injury of face, initial encounter: Secondary | ICD-10-CM | POA: Diagnosis present

## 2015-09-18 DIAGNOSIS — Z79899 Other long term (current) drug therapy: Secondary | ICD-10-CM | POA: Insufficient documentation

## 2015-09-18 DIAGNOSIS — Y9389 Activity, other specified: Secondary | ICD-10-CM | POA: Insufficient documentation

## 2015-09-18 DIAGNOSIS — T148XXA Other injury of unspecified body region, initial encounter: Secondary | ICD-10-CM

## 2015-09-18 DIAGNOSIS — W228XXA Striking against or struck by other objects, initial encounter: Secondary | ICD-10-CM | POA: Diagnosis not present

## 2015-09-18 DIAGNOSIS — Y9289 Other specified places as the place of occurrence of the external cause: Secondary | ICD-10-CM | POA: Diagnosis not present

## 2015-09-18 DIAGNOSIS — S0081XA Abrasion of other part of head, initial encounter: Secondary | ICD-10-CM | POA: Diagnosis not present

## 2015-09-18 DIAGNOSIS — Y990 Civilian activity done for income or pay: Secondary | ICD-10-CM | POA: Insufficient documentation

## 2015-09-18 MED ORDER — ACETAMINOPHEN 500 MG PO TABS
1000.0000 mg | ORAL_TABLET | Freq: Once | ORAL | Status: AC
  Administered 2015-09-18: 1000 mg via ORAL
  Filled 2015-09-18: qty 2

## 2015-09-18 MED ORDER — BACITRACIN ZINC 500 UNIT/GM EX OINT: TOPICAL_OINTMENT | Freq: Two times a day (BID) | CUTANEOUS | Status: DC

## 2015-09-18 MED ORDER — BACITRACIN ZINC 500 UNIT/GM EX OINT: 1.0000 "application " | TOPICAL_OINTMENT | Freq: Two times a day (BID) | CUTANEOUS | Status: DC

## 2015-09-18 NOTE — ED Notes (Signed)
Pt verbalizes understanding of d/c instructions and denies any further needs at this time. 

## 2015-09-18 NOTE — ED Provider Notes (Signed)
CSN: [id number]     Arrival date & time 09/18/15  5621 History   First MD Initiated Contact with Patient 09/18/15 [phone]     Chief Complaint  Patient presents with  . Facial Injury     (Consider location/radiation/quality/duration/timing/severity/associated sxs/prior Treatment) Patient is a 29 y.o. female presenting with facial injury. The history is provided by the patient.  Facial Injury Injury mechanism: corner of a lid scratched right upper cheek  Location:  R cheek Pain details:    Quality:  Aching   Severity:  Moderate   Timing:  Constant   Progression:  Unchanged Chronicity:  New Foreign body present:  No foreign bodies Relieved by:  Nothing Worsened by:  Nothing tried Ineffective treatments:  None tried Associated symptoms: no altered mental status, no double vision, no ear pain, no epistaxis, no headaches, no loss of consciousness, no malocclusion, no neck pain, no rhinorrhea, no trismus and no vomiting   Risk factors: no alcohol use     Past Medical History  Diagnosis Date  . Vertigo    History reviewed. No pertinent past surgical history. History reviewed. No pertinent family history. Social History  Substance Use Topics  . Smoking status: Never Smoker   . Smokeless tobacco: None  . Alcohol Use: No   OB History    No data available     Review of Systems  HENT: Negative for ear pain, nosebleeds and rhinorrhea.   Eyes: Negative for double vision.  Gastrointestinal: Negative for vomiting.  Musculoskeletal: Negative for back pain, neck pain and neck stiffness.  Neurological: Negative for dizziness, seizures, loss of consciousness, facial asymmetry, speech difficulty, weakness, numbness and headaches.  All other systems reviewed and are negative.     Allergies  Review of patient's allergies indicates no known allergies.  Home Medications   Prior to Admission medications   Medication Sig Start Date End Date Taking? Authorizing Provider  metoCLOPramide  (REGLAN) 10 MG tablet Take 1 tablet (10 mg total) by mouth every 6 (six) hours as needed for nausea (nausea/headache). 06/01/15   Mirian Mo, MD  nitrofurantoin, macrocrystal-monohydrate, (MACROBID) 100 MG capsule Take 1 capsule (100 mg total) by mouth 2 (two) times daily. X 7 days 06/01/15   Mirian Mo, MD   BP 148/76 mmHg  Pulse 78  Temp(Src) 97.5 F (36.4 C) (Oral)  Resp 16  Ht  (1.702 m)  Wt 250 lb (113.399 kg)  BMI 39.15 kg/m2  SpO2 100%  LMP 09/10/2015 (Exact Date) Physical Exam  Constitutional: She is oriented to person, place, and time. She appears well-developed and well-nourished. No distress.  HENT:  Head: Normocephalic. Head is without raccoon's eyes and without Battle's sign.    Right Ear: No mastoid tenderness. No hemotympanum.  Left Ear: No mastoid tenderness. No hemotympanum.  Nose: No nasal deformity, septal deviation or nasal septal hematoma.  Mouth/Throat: Oropharynx is clear and moist.  Facial bones stable no swelling bruising or crepitance  Eyes: Conjunctivae and EOM are normal. Pupils are equal, round, and reactive to light.  Neck: Normal range of motion. Neck supple. No tracheal deviation present.  Cardiovascular: Normal rate, regular rhythm and intact distal pulses.   Pulmonary/Chest: Effort normal and breath sounds normal. No respiratory distress. She has no wheezes. She has no rales.  Abdominal: Soft. Bowel sounds are normal. There is no tenderness. There is no rebound and no guarding.  Musculoskeletal: Normal range of motion.       Cervical back: Normal.  Thoracic back: Normal.       Lumbar back: Normal.  Neurological: She is alert and oriented to person, place, and time.    ED Course  Procedures (including critical care time) Labs Review Labs Reviewed - No data to display  Imaging Review No results found. I have personally reviewed and evaluated these images and lab results as part of my medical decision-making.   EKG  Interpretation None      MDM   Final diagnoses:  None     Visual Acuity  Right Eye Distance: 20/70 Left Eye Distance: 20/70 Bilateral Distance: 20/50  Right Eye Near:   Left Eye Near:    Bilateral Near:    Wound care and bacitracin ointment keep clean and dry   Michaela Broski, MD 09/18/15 [phone]

## 2015-09-18 NOTE — ED Notes (Signed)
Pt is sent by work and has authorization for treatment, she states no UDS is necessary.

## 2015-09-18 NOTE — ED Notes (Signed)
Patient states that she thinks she made need glasses.

## 2015-09-18 NOTE — ED Notes (Signed)
Pt closed a lid at work on her face, injuring right cheek bone and has a small 1cm cut that is approximated and not bleeding to outside of eye.  Denies LOC, denies blurred vision, did not get hit in the eye, took ibuprofen prior to arrival, slight redness to area, no gross swelling appreciated.

## 2016-04-30 ENCOUNTER — Emergency Department (HOSPITAL_BASED_OUTPATIENT_CLINIC_OR_DEPARTMENT_OTHER): Payer: Medicaid Other

## 2016-04-30 ENCOUNTER — Encounter (HOSPITAL_BASED_OUTPATIENT_CLINIC_OR_DEPARTMENT_OTHER): Payer: Self-pay | Admitting: *Deleted

## 2016-04-30 ENCOUNTER — Emergency Department (HOSPITAL_BASED_OUTPATIENT_CLINIC_OR_DEPARTMENT_OTHER)
Admission: EM | Admit: 2016-04-30 | Discharge: 2016-04-30 | Disposition: A | Discharge status: Home / Self Care | Payer: Medicaid Other | Attending: Physician Assistant | Admitting: Physician Assistant

## 2016-04-30 DIAGNOSIS — M79673 Pain in unspecified foot: Secondary | ICD-10-CM

## 2016-04-30 DIAGNOSIS — M79671 Pain in right foot: Secondary | ICD-10-CM | POA: Diagnosis not present

## 2016-04-30 DIAGNOSIS — M25571 Pain in right ankle and joints of right foot: Secondary | ICD-10-CM | POA: Diagnosis present

## 2016-04-30 DIAGNOSIS — G8929 Other chronic pain: Secondary | ICD-10-CM | POA: Diagnosis not present

## 2016-04-30 NOTE — ED Triage Notes (Signed)
C/o pain in right ankle pain. 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 Pt just started new job that she is standing a lot and now ankle has been hurting for about 1 week. No obvious injury.

## 2016-04-30 NOTE — ED Provider Notes (Signed)
[facility] Provider Note   CSN: [id number] Arrival date & time: 04/30/16  3382  First Provider Contact:  First MD Initiated Contact with Patient 04/30/16 [phone]        History   Chief Complaint Chief Complaint  Patient presents with  . Ankle Pain    HPI Vitina Rad is a 30 y.o. female.  HPI   Patient's age 30 year old female presenting with 65-year-old injury to her right ankle. Patient stepped off an extra 2 years ago and has had ankle and foot pain since then. Patient reports that her new job she's been standing a lot and has had increased pain. Patient wore wears supportive shoes. She's had no new trauma.  Past Medical History:  Diagnosis Date  . Vertigo     There are no active problems to display for this patient.   History reviewed. No pertinent surgical history.  OB History    No data available       Home Medications    Prior to Admission medications   Not on File    Family History No family history on file.  Social History Social History  Substance Use Topics  . Smoking status: Never Smoker  . Smokeless tobacco: Never Used  . Alcohol use No     Allergies   Review of patient's allergies indicates no known allergies.   Review of Systems Review of Systems  Constitutional: Negative for activity change.  Respiratory: Negative for shortness of breath.   Cardiovascular: Negative for chest pain.  Gastrointestinal: Negative for abdominal pain.  Musculoskeletal: Positive for arthralgias.     Physical Exam Updated Vital Signs BP 142/97 (BP Location: Left Arm)   Pulse 79   Temp 98 F (36.7 C) (Oral)   Resp 18   Ht 5\' 7"  (1.702 m)   Wt 250 lb (113.4 kg)   SpO2 100%   BMI 39.16 kg/m   Physical Exam  Constitutional: She is oriented to person, place, and time. She appears well-developed and well-nourished.  HENT:  Head: Normocephalic and atraumatic.  Eyes: Right eye exhibits no discharge.  Cardiovascular: Normal rate,  regular rhythm and normal heart sounds.   No murmur heard. Pulmonary/Chest: Effort normal and breath sounds normal. She has no wheezes. She has no rales.  Abdominal: She exhibits no distension. There is no tenderness.  Musculoskeletal:  Right ankle appears normal. No abrasions. No swelling. Range of motion is complete.  Neurological: She is oriented to person, place, and time.  Skin: Skin is warm and dry. She is not diaphoretic.  Psychiatric: She has a normal mood and affect.  Nursing note and vitals reviewed.    ED Treatments / Results  Labs (all labs ordered are listed, but only abnormal results are displayed) Labs Reviewed - No data to display  EKG  EKG Interpretation None       Radiology Dg Ankle Complete Right  Result Date: 04/30/2016 CLINICAL DATA:  Right ankle and foot pain 2 weeks with swelling yesterday. No recent injury. EXAM: RIGHT ANKLE - COMPLETE 3+ VIEW COMPARISON:  None. FINDINGS: Mild diffuse soft tissue swelling over the ankle. The ankle mortise is within normal. No acute fracture or dislocation. Small inferior calcaneal spur. IMPRESSION: No acute findings. Electronically Signed   By: Elberta Fortis M.D.   On: 04/30/2016 08:38    Procedures Procedures (including critical care time)  Medications Ordered in ED Medications - No data to display   Initial Impression / Assessment and Plan / ED Course  I have reviewed the triage vital signs and the nursing notes.  Pertinent labs & imaging results that were available during my care of the patient were reviewed by me and considered in my medical decision making (see chart for details).  Clinical Course    Patient here after an injury 2 years ago to her right foot. No swelling or external signs of injury. We'll get x-ray. Differential includes laxity of the tendons from remote injury vs flat feet causing pain.  We will give her an Ace bandage for support. Have her follow up with sports medicine as needed.  Final  Clinical Impressions(s) / ED Diagnoses   Final diagnoses:  None    New Prescriptions New Prescriptions   No medications on file     Courteney Randall An, MD 04/30/16 [phone]

## 2016-04-30 NOTE — Discharge Instructions (Signed)
Follow up with a sports med or PCP for your chronic foot pain. USe ibuprofen to help with symtpoms and wear cushioned foot wear.

## 2017-07-30 ENCOUNTER — Encounter (HOSPITAL_BASED_OUTPATIENT_CLINIC_OR_DEPARTMENT_OTHER): Payer: Self-pay | Admitting: *Deleted

## 2017-07-30 ENCOUNTER — Emergency Department (HOSPITAL_BASED_OUTPATIENT_CLINIC_OR_DEPARTMENT_OTHER)
Admission: EM | Admit: 2017-07-30 | Discharge: 2017-07-30 | Disposition: A | Discharge status: Home / Self Care | Payer: Self-pay | Attending: Emergency Medicine | Admitting: Emergency Medicine

## 2017-07-30 DIAGNOSIS — M545 Low back pain, unspecified: Secondary | ICD-10-CM

## 2017-07-30 LAB — URINALYSIS, ROUTINE W REFLEX MICROSCOPIC
Bilirubin Urine: NEGATIVE
GLUCOSE, UA: NEGATIVE mg/dL
Hgb urine dipstick: NEGATIVE
Ketones, ur: NEGATIVE mg/dL
Nitrite: NEGATIVE
PH: 7 (ref 5.0–8.0)
PROTEIN: NEGATIVE mg/dL
Specific Gravity, Urine: 1.02 (ref 1.005–1.030)

## 2017-07-30 LAB — URINALYSIS, MICROSCOPIC (REFLEX): RBC / HPF: NONE SEEN RBC/hpf (ref 0–5)

## 2017-07-30 MED ORDER — CYCLOBENZAPRINE HCL 10 MG PO TABS: 10.0000 mg | ORAL_TABLET | Freq: Two times a day (BID) | ORAL | 0 refills | Status: AC | PRN

## 2017-07-30 NOTE — ED Provider Notes (Signed)
[facility] EMERGENCY DEPARTMENT Provider Note   CSN: [id number] Arrival date & time: 07/30/17  1943     History   Chief Complaint Chief Complaint  Patient presents with  . Back Pain  . Otalgia    HPI Claudia Banks is a 31 y.o. female.  The history is provided by the patient and medical records. No language interpreter was used.  Back Pain   Pertinent negatives include no chest pain, no fever and no abdominal pain.  Otalgia  Pertinent negatives include no ear discharge, no sore throat, no abdominal pain, no vomiting and no cough.  Claudia Banks is a 31 y.o. female  with a PMH of vertigo who presents to the Emergency Department with two complaints:  1. "whooshing" sound in right ear which sounds like ocean waves intermittently x 2 days. She has had nasal congestion for the last 5-7 days as well. No ear pain or tinnitus. Did not put anything in the ear. No sore throat, neck pain, headache, fever, chills or sinus pressure. No medications or treatments prior to arrival. No alleviating or aggravating factors noted.   2. Intermittent aching right lower back pain. Patient states that this has been going on for several months. Pain will get worse with long work days and improve with rest. Pain also is worse with certain positions. She has tried tylenol and ibuprofen with mild improvement. Patient denies upper back or neck pain. No fever, saddle anesthesia, weakness, numbness, urinary complaints including retention/incontinence. No history of cancer, IVDU, or recent spinal procedures.   Past Medical History:  Diagnosis Date  . Vertigo     There are no active problems to display for this patient.   History reviewed. No pertinent surgical history.  OB History    No data available       Home Medications    Prior to Admission medications   Medication Sig Start Date End Date Taking? Authorizing Provider  cyclobenzaprine (FLEXERIL) 10 MG tablet Take 1 tablet (10  mg total) by mouth 2 (two) times daily as needed for muscle spasms. 07/30/17   Pearce Littlefield, Chase Picket, PA-C    Family History No family history on file.  Social History Social History  Substance Use Topics  . Smoking status: Never Smoker  . Smokeless tobacco: Never Used  . Alcohol use No     Allergies   Patient has no known allergies.   Review of Systems Review of Systems  Constitutional: Negative for chills and fever.  HENT: Positive for congestion. Negative for ear discharge, ear pain, sinus pain, sinus pressure and sore throat.   Respiratory: Negative for cough and shortness of breath.   Cardiovascular: Negative for chest pain.  Gastrointestinal: Negative for abdominal pain, nausea and vomiting.  Genitourinary: Negative for difficulty urinating.  Musculoskeletal: Positive for back pain.     Physical Exam Updated Vital Signs BP 113/74   Pulse 97   Temp 98.4 F (36.9 C) (Oral)   Resp 16   Ht 5\' 8"  (1.727 m)   Wt 113.4 kg (250 lb)   LMP 07/16/2017   SpO2 100%   BMI 38.01 kg/m   Physical Exam  Constitutional: She is oriented to person, place, and time. She appears well-developed and well-nourished.  HENT:  Fluid behind right TM. No erythema. No mastoid tenderness. Left TM normal.  Neck:  No midline or paraspinal tenderness. Full ROM without pain.  Cardiovascular: Normal rate, regular rhythm, normal heart sounds and intact distal pulses.   Pulmonary/Chest: Effort  normal and breath sounds normal. No respiratory distress.  Abdominal: Soft. Bowel sounds are normal. She exhibits no distension. There is no tenderness.  Musculoskeletal:  No midline T/L tenderness. Tenderness to palpation of right lumbar region. 5/5 muscle strength in bilateral LE's. Straight leg raises are negative bilaterally for radicular symptoms. Able to ambulate independently with steady gait.  Neurological: She is alert and oriented to person, place, and time. She has normal reflexes.  Bilateral  lower extremities neurovascularly intact.  Skin: Skin is warm and dry. No rash noted. No erythema.  Nursing note and vitals reviewed.    ED Treatments / Results  Labs (all labs ordered are listed, but only abnormal results are displayed) Labs Reviewed  URINALYSIS, ROUTINE W REFLEX MICROSCOPIC - Abnormal; Notable for the following:       Result Value   APPearance CLOUDY (*)    Leukocytes, UA LARGE (*)    All other components within normal limits  URINALYSIS, MICROSCOPIC (REFLEX) - Abnormal; Notable for the following:    Bacteria, UA FEW (*)    Squamous Epithelial / LPF 6-30 (*)    All other components within normal limits    EKG  EKG Interpretation None       Radiology No results found.  Procedures Procedures (including critical care time)  Medications Ordered in ED Medications - No data to display   Initial Impression / Assessment and Plan / ED Course  I have reviewed the triage vital signs and the nursing notes.  Pertinent labs & imaging results that were available during my care of the patient were reviewed by me and considered in my medical decision making (see chart for details).    Claudia Banks is a 31 y.o. female who presents to ED for two complaints:  1. "whooshing" sound in right ear which sounds like ocean waves intermittently x 2 days. No ear pain. Fluid behind right TM. No signs of infection. She is having nasal congestion as well. Will start on flonase and zytrec. PCP follow up encouraged if symptoms persist.   2. Right-sided low back pain. Patient demonstrates no lower extremity weakness, saddle anesthesia, bowel or bladder incontinence or neuro deficits. No concern for cauda equina. No fevers or other infectious symptoms to suggest that the patient's back pain is due to an infection. Lower extremities are neurovascularly intact and patient is ambulating without difficulty. I have reviewed return precautions, including the development of any of these  signs or symptoms and the patient has voiced understanding. I reviewed symptomatic home care instructions and sports medicine follow-up if symptoms do not improve. RX for Flexeril. Patient voiced understanding and agreement with plan. All questions answered.   Final Clinical Impressions(s) / ED Diagnoses   Final diagnoses:  Right-sided low back pain without sciatica, unspecified chronicity    New Prescriptions Discharge Medication List as of 07/30/2017  9:18 PM    START taking these medications   Details  cyclobenzaprine (FLEXERIL) 10 MG tablet Take 1 tablet (10 mg total) by mouth 2 (two) times daily as needed for muscle spasms., Starting Thu 07/30/2017, Print         Claudia Banks, Chase PicketJaime Pilcher, PA-C 07/31/17 [id number]    Gwyneth SproutPlunkett, Whitney, MD 08/01/17 [id number]

## 2017-07-30 NOTE — ED Notes (Signed)
ED Provider at bedside. 

## 2017-07-30 NOTE — Discharge Instructions (Signed)
It was my pleasure taking care of you today!   Flexeril as needed for muscle strain.  Can also alternate between Tylenol and ibuprofen as needed for additional pain relief.  If symptoms do not improve in 1 week, please call the sports medicine physician listed to schedule a follow-up appointment.  Flonase and Zyrtec or Claritin can help with congestion.   Return to ER for new or worsening symptoms, any additional concerns.

## 2017-07-30 NOTE — ED Triage Notes (Signed)
Lower back pain. Certain movements make the pain worse. She also wants her right ear checked. She has a roaring ocean sound inside her ear.

## 2017-08-09 ENCOUNTER — Other Ambulatory Visit: Payer: Self-pay

## 2017-08-09 ENCOUNTER — Encounter (HOSPITAL_BASED_OUTPATIENT_CLINIC_OR_DEPARTMENT_OTHER): Payer: Self-pay | Admitting: Emergency Medicine

## 2017-08-09 ENCOUNTER — Emergency Department (HOSPITAL_BASED_OUTPATIENT_CLINIC_OR_DEPARTMENT_OTHER)
Admission: EM | Admit: 2017-08-09 | Discharge: 2017-08-09 | Disposition: A | Discharge status: Home / Self Care | Payer: Self-pay | Attending: Emergency Medicine | Admitting: Emergency Medicine

## 2017-08-09 DIAGNOSIS — L02419 Cutaneous abscess of limb, unspecified: Secondary | ICD-10-CM

## 2017-08-09 DIAGNOSIS — Z79899 Other long term (current) drug therapy: Secondary | ICD-10-CM | POA: Insufficient documentation

## 2017-08-09 DIAGNOSIS — L02411 Cutaneous abscess of right axilla: Secondary | ICD-10-CM | POA: Insufficient documentation

## 2017-08-09 MED ORDER — LIDOCAINE-EPINEPHRINE (PF) 2 %-1:200000 IJ SOLN
10.0000 mL | Freq: Once | INTRAMUSCULAR | Status: DC
  Filled 2017-08-09: qty 10

## 2017-08-09 NOTE — ED Provider Notes (Signed)
[facility] EMERGENCY DEPARTMENT Provider Note   CSN: [id number] Arrival date & time: 08/09/17  1524     History   Chief Complaint Chief Complaint  Patient presents with  . Abscess    HPI Claudia Banks is a 31 y.o. female who presents to the ED with complaints of painful/swollen area to R axilla x 4 days. Patient reports she noted a "bump" to her right axilla that has been progressively worsening in size and pain. She has tried applying warm compresses and taken Tylenol and Ibuprofen without relief. Has noted some purulent drainage from the area over the past 24 hours. States she has hx of similar, but less severe and always self limiting within a few days previously. Denies fever, chills, vomiting, numbness, or tingling.   HPI  Past Medical History:  Diagnosis Date  . Vertigo     There are no active problems to display for this patient.   History reviewed. No pertinent surgical history.  OB History    No data available       Home Medications    Prior to Admission medications   Medication Sig Start Date End Date Taking? Authorizing Provider  cyclobenzaprine (FLEXERIL) 10 MG tablet Take 1 tablet (10 mg total) by mouth 2 (two) times daily as needed for muscle spasms. 07/30/17   Ward, Chase PicketJaime Pilcher, PA-C    Family History History reviewed. No pertinent family history.  Social History Social History   Tobacco Use  . Smoking status: Never Smoker  . Smokeless tobacco: Never Used  Substance Use Topics  . Alcohol use: No  . Drug use: No     Allergies   Patient has no known allergies.   Review of Systems Review of Systems  Constitutional: Negative for chills and fever.  HENT: Negative for congestion.   Respiratory: Negative for shortness of breath.   Gastrointestinal: Negative for constipation, diarrhea and vomiting.  Skin:       Painful "bump" to R axilla      Physical Exam Updated Vital Signs BP 137/83 (BP Location: Left Arm)   Pulse  (!) 104   Temp 99.3 F (37.4 C) (Oral)   Resp 20   Ht 5\' 8"  (1.727 m)   Wt 113.4 kg (250 lb)   LMP 07/16/2017   SpO2 100%   BMI 38.01 kg/m   Physical Exam  Constitutional: She appears well-developed and well-nourished. No distress.  HENT:  Head: Normocephalic and atraumatic.  Eyes: Conjunctivae are normal. Right eye exhibits no discharge. Left eye exhibits no discharge.  Cardiovascular: Normal rate and regular rhythm.  Neurological: She is alert.  Clear speech.   Skin: Skin is warm and dry.  R axilla: 2cm diameter area of fluctuance centrally with 1cm area of fluctuance inferiorly at the 4:00 position, inferior area has active purulent drainage. Diffuse induration throughout the axilla. The area is diffusely tender to palpation. 2+ radial pulses, sensation intact distally.   Psychiatric: She has a normal mood and affect. Her behavior is normal. Thought content normal.  Nursing note and vitals reviewed.    ED Treatments / Results   Procedures .Marland Kitchen.Incision and Drainage Date/Time: 08/09/2017 6:14 PM Performed by: Cherly AndersonPetrucelli, Samantha R, PA-C Authorized by: Cherly AndersonPetrucelli, Samantha R, PA-C   Consent:    Consent obtained:  Verbal   Consent given by:  Patient   Risks discussed:  Bleeding, incomplete drainage, pain and infection   Alternatives discussed:  Alternative treatment and no treatment Location:    Type:  Abscess  Location:  Upper extremity Pre-procedure details:    Skin preparation:  Betadine Anesthesia (see MAR for exact dosages):    Anesthesia method:  Local infiltration   Local anesthetic:  Lidocaine 2% WITH epi Procedure details:    Incision types:  Stab incision   Scalpel blade:  11   Wound management:  Probed and deloculated   Drainage:  Purulent   Drainage amount:  Moderate   Wound treatment:  Wound left open   Packing materials:  None Post-procedure details:    Patient tolerance of procedure:  Tolerated well, no immediate complications     Medications  Ordered in ED Medications  lidocaine-EPINEPHrine (XYLOCAINE W/EPI) 2 %-1:200000 (PF) injection 10 mL (not administered)  3mL administered    Initial Impression / Assessment and Plan / ED Course  I have reviewed the triage vital signs and the nursing notes.  Pertinent labs & imaging results that were available during my care of the patient were reviewed by me and considered in my medical decision making (see chart for details).   Patient presents with abscess to R axilla with active drainage. Patient afebrile in the department, no reported fevers at home. I&D performed with purulent drainage. Aftercare instructions provided to patient including warm compresses and ibuprofen/tylenol for pain. Instructed general surgery follow-up given patient has had hx of similar.   Vitals:   08/09/17 1535 08/09/17 1758  BP: 137/83 124/64  Pulse: (!) 104 98  Resp: 20 18  Temp: 99.3 F (37.4 C)   SpO2: 100% 100%    Final Clinical Impressions(s) / ED Diagnoses   Final diagnoses:  Abscess of axilla    ED Discharge Orders    None       Cherly Andersonetrucelli, Samantha R, PA-C 08/09/17 Silva [id number]    Raeford RazorKohut, Stephen, MD 08/11/17 1104

## 2017-08-09 NOTE — Discharge Instructions (Addendum)
Continue to apply warm compresses to the area. Take Ibuprofen and Tylenol as needed for pain. Follow up with general surgery for re-evaluation. Return to the emergency department for new or worsening symptoms such as fever.

## 2017-08-09 NOTE — ED Triage Notes (Signed)
Abscess to R axilla x 3 days.  

## 2017-08-09 NOTE — ED Notes (Signed)
Large fluctuant area to right axilla, one area draining purulent drainage

## 2017-08-09 NOTE — ED Provider Notes (Signed)
5:33 PM patient seen in conjunction with Petrucelli PA-C.   Patient presents with right axillary abscess, likely associated with undiagnosed hidradenitis suppurativa.  Patient has had similar areas in the past that have resolved spontaneously.  Incision and drainage to be performed.  BP 137/83 (BP Location: Left Arm)   Pulse (!) 104   Temp 99.3 F (37.4 C) (Oral)   Resp 20   Ht 5\' 8"  (1.727 m)   Wt 113.4 kg (250 lb)   LMP 07/16/2017   SpO2 100%   BMI 38.01 kg/m     Renne CriglerGeiple, Johnna Bollier, PA-C 08/09/17 1734    Raeford RazorKohut, Stephen, MD 08/11/17 1104

## 2018-07-18 ENCOUNTER — Emergency Department (HOSPITAL_BASED_OUTPATIENT_CLINIC_OR_DEPARTMENT_OTHER)
Admission: EM | Admit: 2018-07-18 | Discharge: 2018-07-18 | Disposition: A | Discharge status: Home / Self Care | Payer: Self-pay | Attending: Emergency Medicine | Admitting: Emergency Medicine

## 2018-07-18 ENCOUNTER — Other Ambulatory Visit: Payer: Self-pay

## 2018-07-18 ENCOUNTER — Encounter (HOSPITAL_BASED_OUTPATIENT_CLINIC_OR_DEPARTMENT_OTHER): Payer: Self-pay | Admitting: Emergency Medicine

## 2018-07-18 DIAGNOSIS — K047 Periapical abscess without sinus: Secondary | ICD-10-CM | POA: Insufficient documentation

## 2018-07-18 MED ORDER — PENICILLIN V POTASSIUM 500 MG PO TABS: 500.0000 mg | ORAL_TABLET | Freq: Four times a day (QID) | ORAL | 0 refills | Status: AC

## 2018-07-18 NOTE — ED Provider Notes (Signed)
[facility] EMERGENCY DEPARTMENT Provider Note   CSN: [id number] Arrival date & time: 07/18/18  1327     History   Chief Complaint Chief Complaint  Patient presents with  . Dental Pain    HPI Claudia Banks is a 32 y.o. female is here for evaluation of dental pain onset on Friday, sudden, gradually worsening.  Pain is to her right bottom gumline, constant, throbbing, severe.  She has tried ibuprofen, Advil, Aleve, Orajel without relief.  Aggravated with palpation and eating and cold fluids.  She denies fevers, chills, trismus, facial swelling, muffled voice.  HPI  Past Medical History:  Diagnosis Date  . Vertigo     There are no active problems to display for this patient.   History reviewed. No pertinent surgical history.   OB History   None      Home Medications    Prior to Admission medications   Medication Sig Start Date End Date Taking? Authorizing Provider  cyclobenzaprine (FLEXERIL) 10 MG tablet Take 1 tablet (10 mg total) by mouth 2 (two) times daily as needed for muscle spasms. 07/30/17   Ward, Chase Picket, PA-C  penicillin v potassium (VEETID) 500 MG tablet Take 1 tablet (500 mg total) by mouth 4 (four) times daily for 7 days. 07/18/18 07/25/18  Liberty Handy, PA-C    Family History No family history on file.  Social History Social History   Tobacco Use  . Smoking status: Never Smoker  . Smokeless tobacco: Never Used  Substance Use Topics  . Alcohol use: No  . Drug use: No     Allergies   Patient has no known allergies.   Review of Systems Review of Systems  HENT: Positive for dental problem.   All other systems reviewed and are negative.    Physical Exam Updated Vital Signs BP (!) 103/52 (BP Location: Left Arm)   Pulse 88   Temp 98.4 F (36.9 C) (Oral)   Resp 16   Ht 5\' 9"  (1.753 m)   Wt 117.9 kg   LMP 06/24/2018   SpO2 100%   BMI 38.40 kg/m   Physical Exam  Constitutional: She is oriented to person,  place, and time. She appears well-developed and well-nourished.  Non-toxic appearance.  HENT:  Head: Normocephalic.  Right Ear: External ear normal.  Left Ear: External ear normal.  Nose: Nose normal.  Mouth/Throat: Abnormal dentition. Dental abscesses and dental caries present.    Gumline at the base of teeth #27, 26, 25 is erythematous, edematous.  There is black/yellow discoloration of the base of these teeth.  Palpation of these areas produces scant amount of purulent discharge and blood.  Poor dentition throughout.  Teeth #1 is cracked and tender with percussion but no surrounding gumline erythema, edema, discharge.  No trismus.  Moist mucous membranes.  Oropharynx and tonsils normal.  No sublingual fullness, normal protrusion of the tongue.  No muffled voice.  No facial swelling.  Eyes: Conjunctivae and EOM are normal.  Neck: Full passive range of motion without pain.  Cardiovascular: Normal rate.  Pulmonary/Chest: Effort normal. No tachypnea. No respiratory distress.  Musculoskeletal: Normal range of motion.  Neurological: She is alert and oriented to person, place, and time.  Skin: Skin is warm and dry. Capillary refill takes less than 2 seconds.  Psychiatric: Her behavior is normal. Thought content normal.     ED Treatments / Results  Labs (all labs ordered are listed, but only abnormal results are displayed) Labs Reviewed - No  data to display  EKG None  Radiology No results found.  Procedures Procedures (including critical care time)  Medications Ordered in ED Medications - No data to display   Initial Impression / Assessment and Plan / ED Course  I have reviewed the triage vital signs and the nursing notes.  Pertinent labs & imaging results that were available during my care of the patient were reviewed by me and considered in my medical decision making (see chart for details).    Dental pain associated with long-standing poor dental health with superimposed  abscess as noted above.  She is afebrile.  No facial swelling.  Tolerating secretions without muffled voice.  Exam is non-concerning for Ludwig angina or other deeper facial or neck infection.  Given that there is obvious abscess, will discharge with penicillin, high-dose NSAIDs and urged follow-up with dentist for ultimate management of dental health and abscess.  I do not think incision and drainage is indicated, purulence is easily obtained with mild compression and I feel like patient can do warm compresses and massage at home.  Strict ED return precautions given. Pt is aware of red flag symptoms that would warrant return to ED for re-evaluation and further treatment. Patient voices understanding and is agreeable to plan.  Final Clinical Impressions(s) / ED Diagnoses   Final diagnoses:  Dental abscess    ED Discharge Orders         Ordered    penicillin v potassium (VEETID) 500 MG tablet  4 times daily     07/18/18 1441           Jerrell Mylar 07/18/18 1444    Linwood Dibbles, MD 07/19/18 2115

## 2018-07-18 NOTE — ED Triage Notes (Signed)
R side dental pain x 2 days.  

## 2018-07-18 NOTE — Discharge Instructions (Signed)
You were seen in the ER for dental pain.  Pain is likely from broken tooth/exposed nerve endings and superimposed infection or collection of pus.  Take 1000 mg of acetaminophen (Tylenol) every 6 hours.  For more pain control take ibuprofen 600 mg every 6 hours.  Use Orajel 15 to 20 minutes prior to eating or drinking to desensitize nerve endings.  You can use desensitizing toothpastes (Sensodyne, Pronamel, [employer] sensitive) throughout the day to help with nerve pain.  Take antibiotic as prescribed. Warm compressions.   Unfortunately, dental pain will continue until you have a full dental evaluation and treatment.  See dental resources attached.  Additionally, I have given you Dr. Mayford Knife and Dr. Leanord Asal contact information, they frequently try to accommodate patients from the Er.  Return to the ER for fevers greater than 100.29F, facial swelling, difficulty opening your jaw despite antibiotics.

## 2020-07-03 ENCOUNTER — Emergency Department (HOSPITAL_BASED_OUTPATIENT_CLINIC_OR_DEPARTMENT_OTHER)
Admission: EM | Admit: 2020-07-03 | Discharge: 2020-07-03 | Disposition: A | Discharge status: Home / Self Care | Payer: BC Managed Care – PPO | Attending: Emergency Medicine | Admitting: Emergency Medicine

## 2020-07-03 ENCOUNTER — Other Ambulatory Visit: Payer: Self-pay

## 2020-07-03 ENCOUNTER — Encounter (HOSPITAL_BASED_OUTPATIENT_CLINIC_OR_DEPARTMENT_OTHER): Payer: Self-pay | Admitting: *Deleted

## 2020-07-03 ENCOUNTER — Emergency Department (HOSPITAL_BASED_OUTPATIENT_CLINIC_OR_DEPARTMENT_OTHER): Payer: BC Managed Care – PPO

## 2020-07-03 DIAGNOSIS — Y93I9 Activity, other involving external motion: Secondary | ICD-10-CM | POA: Diagnosis not present

## 2020-07-03 DIAGNOSIS — Y92410 Unspecified street and highway as the place of occurrence of the external cause: Secondary | ICD-10-CM | POA: Insufficient documentation

## 2020-07-03 DIAGNOSIS — M542 Cervicalgia: Secondary | ICD-10-CM | POA: Insufficient documentation

## 2020-07-03 DIAGNOSIS — M79622 Pain in left upper arm: Secondary | ICD-10-CM | POA: Diagnosis not present

## 2020-07-03 DIAGNOSIS — R519 Headache, unspecified: Secondary | ICD-10-CM | POA: Diagnosis not present

## 2020-07-03 DIAGNOSIS — R079 Chest pain, unspecified: Secondary | ICD-10-CM | POA: Insufficient documentation

## 2020-07-03 MED ORDER — ACETAMINOPHEN 325 MG PO TABS
650.0000 mg | ORAL_TABLET | Freq: Once | ORAL | Status: AC
  Administered 2020-07-03: 650 mg via ORAL
  Filled 2020-07-03: qty 2
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
## 2022-10-04 IMAGING — CR DG CHEST 2V
2 series · 2 of 2 positions shown · non-contrast
Comparison: None.

CLINICAL DATA: Chest pain

EXAM:
CHEST - 2 VIEW

[w chest pa *]
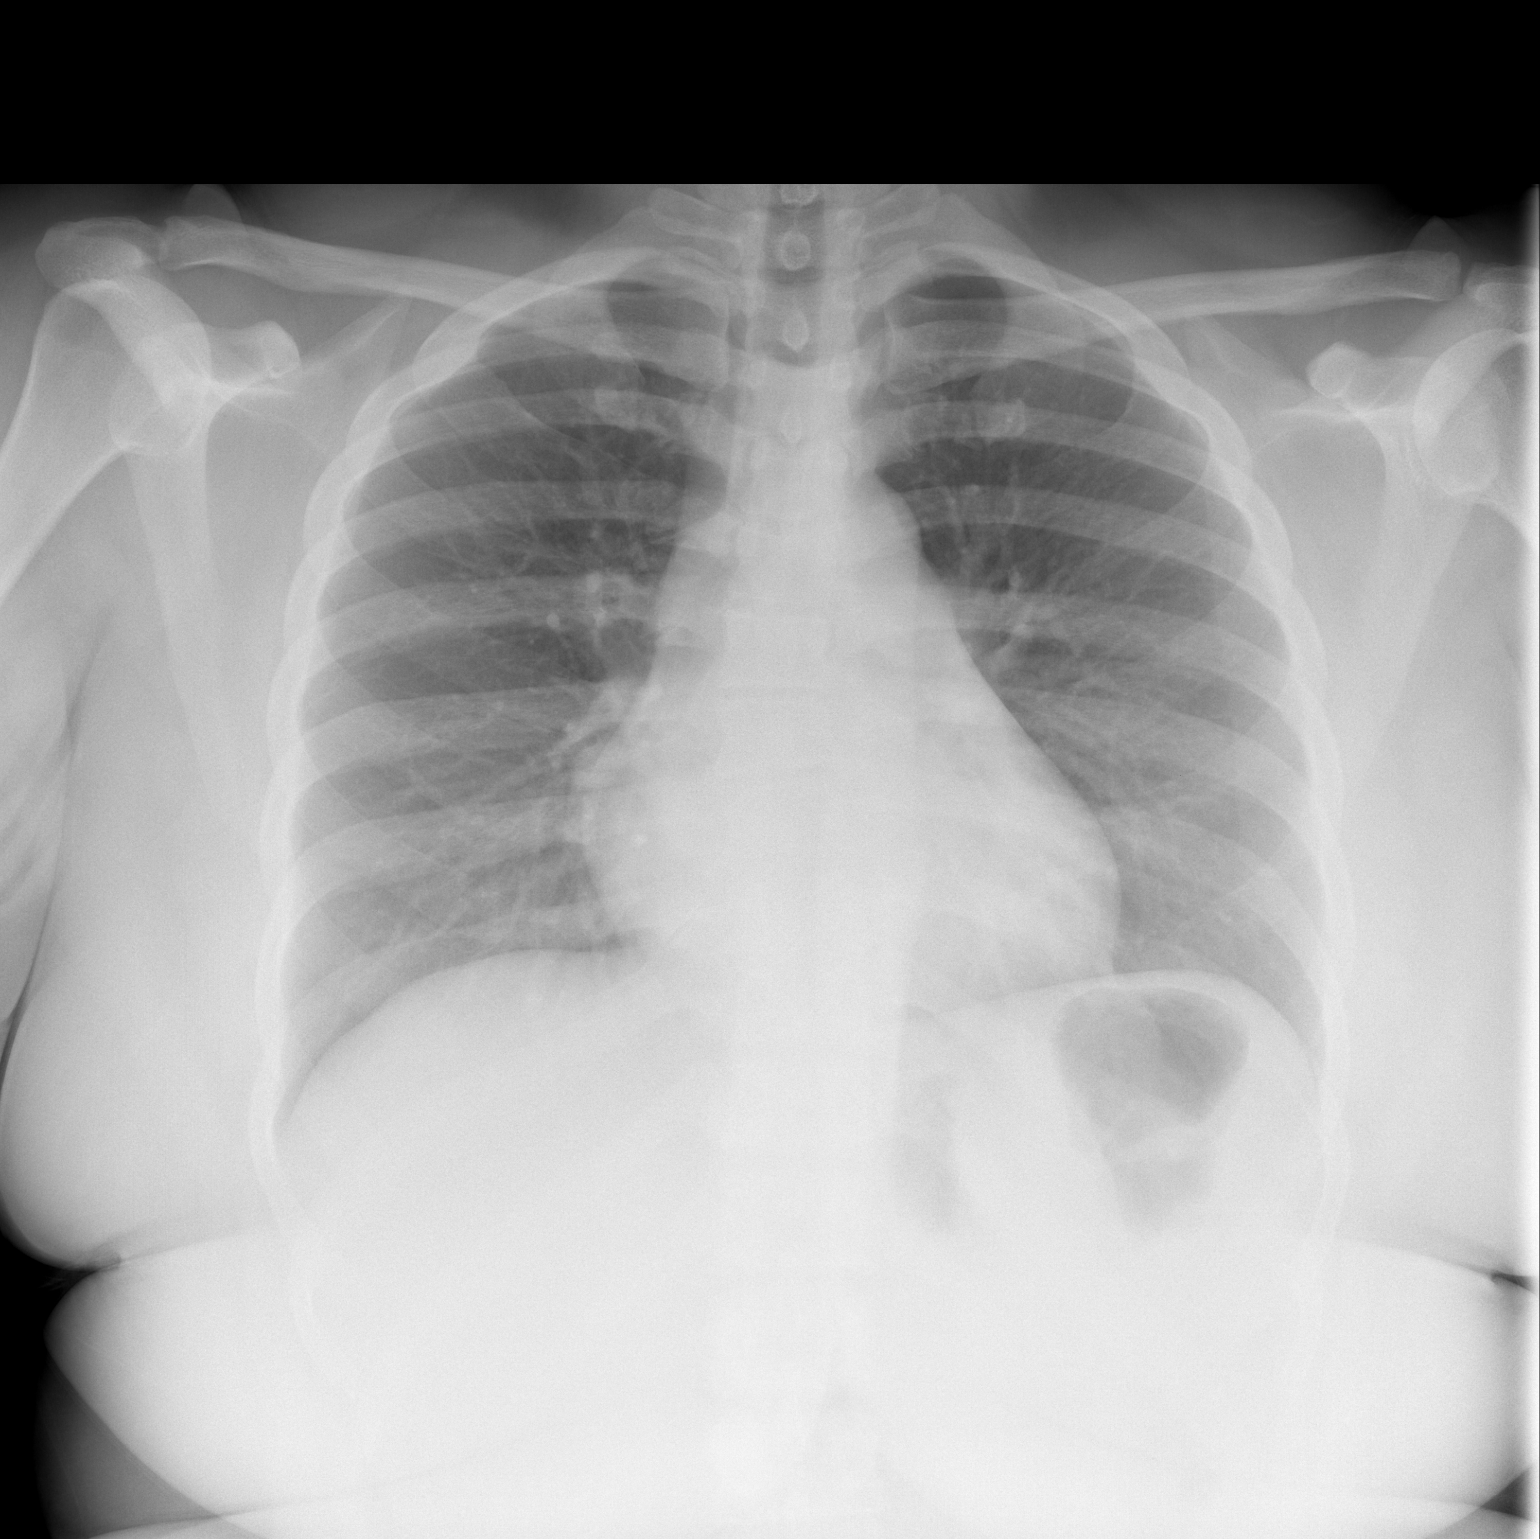

[w chest lat]
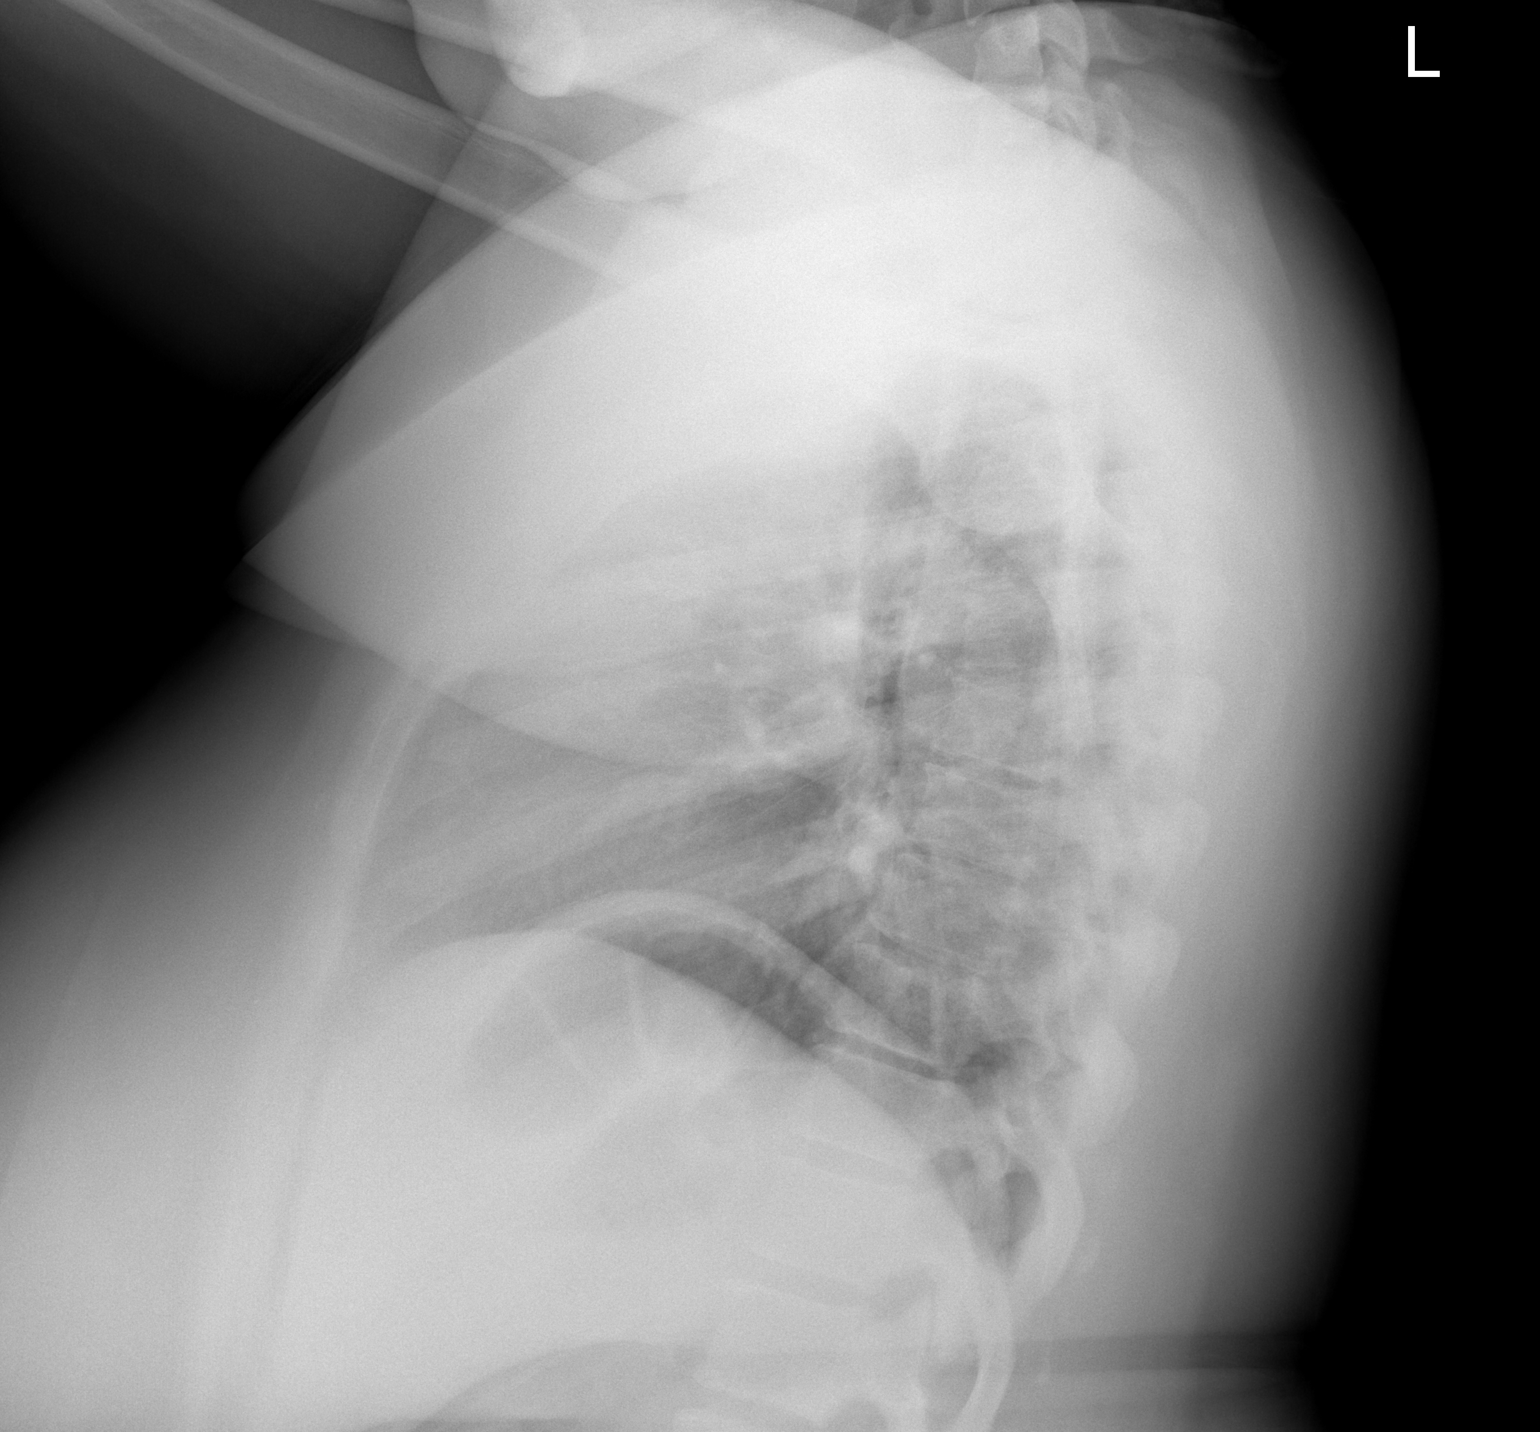

[2 of 2 positions shown; findings below may reference images not displayed]

FINDINGS: The heart size and mediastinal contours are within normal limits.
Both lungs are clear. The visualized skeletal structures are
unremarkable.
IMPRESSION: No active cardiopulmonary disease.

## 2023-01-07 ENCOUNTER — Institutional Professional Consult (permissible substitution): Payer: BC Managed Care – PPO | Admitting: Plastic Surgery

## 2023-03-29 ENCOUNTER — Encounter (HOSPITAL_BASED_OUTPATIENT_CLINIC_OR_DEPARTMENT_OTHER): Payer: Self-pay | Admitting: Emergency Medicine

## 2023-03-29 ENCOUNTER — Other Ambulatory Visit: Payer: Self-pay

## 2023-03-29 ENCOUNTER — Emergency Department (HOSPITAL_BASED_OUTPATIENT_CLINIC_OR_DEPARTMENT_OTHER)
Admission: EM | Admit: 2023-03-29 | Discharge: 2023-03-29 | Discharge status: Against Medical Advice | Payer: Medicaid Other | Attending: Emergency Medicine | Admitting: Emergency Medicine

## 2023-03-29 DIAGNOSIS — Z5321 Procedure and treatment not carried out due to patient leaving prior to being seen by health care provider: Secondary | ICD-10-CM | POA: Insufficient documentation

## 2023-03-29 DIAGNOSIS — R519 Headache, unspecified: Secondary | ICD-10-CM | POA: Diagnosis not present

## 2023-03-29 DIAGNOSIS — H9209 Otalgia, unspecified ear: Secondary | ICD-10-CM | POA: Diagnosis present

## 2023-03-29 NOTE — ED Notes (Signed)
Pt called for room and no answer x2 

## 2023-03-29 NOTE — ED Triage Notes (Signed)
Pt reports swelling on middle of ear for some time. Had drainage last night. Also causing her to have a headache.

## 2023-03-29 NOTE — ED Notes (Signed)
Pt called for room and no answer  

## 2023-05-22 ENCOUNTER — Other Ambulatory Visit: Payer: Self-pay

## 2023-05-22 ENCOUNTER — Other Ambulatory Visit (HOSPITAL_BASED_OUTPATIENT_CLINIC_OR_DEPARTMENT_OTHER): Payer: Self-pay

## 2023-05-22 ENCOUNTER — Emergency Department (HOSPITAL_BASED_OUTPATIENT_CLINIC_OR_DEPARTMENT_OTHER)
Admission: EM | Admit: 2023-05-22 | Discharge: 2023-05-22 | Disposition: A | Discharge status: Home / Self Care | Payer: Medicaid Other | Attending: Emergency Medicine | Admitting: Emergency Medicine

## 2023-05-22 DIAGNOSIS — K029 Dental caries, unspecified: Secondary | ICD-10-CM | POA: Diagnosis not present

## 2023-05-22 DIAGNOSIS — K0889 Other specified disorders of teeth and supporting structures: Secondary | ICD-10-CM

## 2023-05-22 MED ORDER — AMOXICILLIN 500 MG PO CAPS
500.0000 mg | ORAL_CAPSULE | Freq: Three times a day (TID) | ORAL | 0 refills | Status: AC
  Filled 2023-05-22: qty 30, 10d supply, fill #0

## 2023-05-22 NOTE — ED Provider Notes (Signed)
[place] EMERGENCY DEPARTMENT AT [facility] Provider Note   CSN: [id number] Arrival date & time: 05/22/23  9326     History  Chief Complaint  Patient presents with   Dental Pain    Claudia Banks is a 37 y.o. female.  Patient here with right upper dental pain for the last week.  Thinks may be wisdom tooth coming in.  Nothing makes it worse or better.  No fever or chills.  No drooling.  Eating and drinking okay.  The history is provided by the patient.       Home Medications Prior to Admission medications   Medication Sig Start Date End Date Taking? Authorizing Provider  amoxicillin (AMOXIL) 500 MG capsule Take 1 capsule (500 mg total) by mouth 3 (three) times daily for 10 days. 05/22/23 06/01/23 Yes Kerwin Augustus, DO  cyclobenzaprine (FLEXERIL) 10 MG tablet Take 1 tablet (10 mg total) by mouth 2 (two) times daily as needed for muscle spasms. 07/30/17   Ward, Chase Picket, PA-C      Allergies    Patient has no known allergies.    Review of Systems   Review of Systems  Physical Exam Updated Vital Signs  ED Triage Vitals  Encounter Vitals Group     BP 05/22/23 0939 126/81     Systolic BP Percentile --      Diastolic BP Percentile --      Pulse Rate 05/22/23 0937 86     Resp 05/22/23 0937 18     Temp 05/22/23 0937 97.6 F (36.4 C)     Temp Source 05/22/23 0937 Oral     SpO2 05/22/23 0937 98 %     Weight --      Height --      Head Circumference --      Peak Flow --      Pain Score 05/22/23 0938 10     Pain Loc --      Pain Education --      Exclude from Growth Chart --      Physical Exam Vitals and nursing note reviewed.  Constitutional:      General: She is not in acute distress.    Appearance: She is well-developed.  HENT:     Head: Normocephalic and atraumatic.     Mouth/Throat:     Mouth: Mucous membranes are moist.     Comments: Some dental decay to upper molar on right but no trismus or drooling or abscess or facial  swelling  Neurological:     Mental Status: She is alert.     ED Results / Procedures / Treatments   Labs (all labs ordered are listed, but only abnormal results are displayed) Labs Reviewed - No data to display  EKG None  Radiology No results found.  Procedures Procedures    Medications Ordered in ED Medications - No data to display  ED Course/ Medical Decision Making/ A&P                                 Medical Decision Making Risk Prescription drug management.   Claudia Banks here with right upper dental pain.  Looks like a little bit of tooth decay where I suspect wisdom tooth would be coming in.  My suspicion is likely for an impacted wisdom tooth.  Will put her on antibiotics but she has no trismus or drooling.  No fever.  Very well-appearing.  No submandibular swelling.  Will give her information to follow-up with dentistry.  She states she has a dentist that she can follow-up with as well.  Recommend continued use of Tylenol ibuprofen for pain.  Discharged in good condition.  This chart was dictated using voice recognition software.  Despite best efforts to proofread,  errors can occur which can change the documentation meaning.         Final Clinical Impression(s) / ED Diagnoses Final diagnoses:  Pain, dental    Rx / DC Orders ED Discharge Orders          Ordered    amoxicillin (AMOXIL) 500 MG capsule  3 times daily        05/22/23 0943              Virgina Norfolk, DO 05/22/23 [phone]

## 2023-05-22 NOTE — ED Triage Notes (Signed)
Patient presents to ED via POV from home. Here with right upper dental pain x 1 week. Expresses concern for wisdom teeth coming in.

## 2024-08-05 ENCOUNTER — Other Ambulatory Visit: Payer: Self-pay

## 2024-08-05 ENCOUNTER — Emergency Department (HOSPITAL_BASED_OUTPATIENT_CLINIC_OR_DEPARTMENT_OTHER)
Admission: EM | Admit: 2024-08-05 | Discharge: 2024-08-05 | Disposition: A | Discharge status: Home / Self Care | Attending: Emergency Medicine | Admitting: Emergency Medicine

## 2024-08-05 ENCOUNTER — Encounter (HOSPITAL_BASED_OUTPATIENT_CLINIC_OR_DEPARTMENT_OTHER): Payer: Self-pay | Admitting: Emergency Medicine

## 2024-08-05 DIAGNOSIS — R1031 Right lower quadrant pain: Secondary | ICD-10-CM | POA: Diagnosis present

## 2024-08-05 LAB — URINALYSIS, MICROSCOPIC (REFLEX)

## 2024-08-05 LAB — URINALYSIS, ROUTINE W REFLEX MICROSCOPIC
Bilirubin Urine: NEGATIVE
Glucose, UA: NEGATIVE mg/dL
Hgb urine dipstick: NEGATIVE
Ketones, ur: NEGATIVE mg/dL
Nitrite: NEGATIVE
Protein, ur: NEGATIVE mg/dL
Specific Gravity, Urine: 1.02 (ref 1.005–1.030)
pH: 7 (ref 5.0–8.0)

## 2024-08-05 LAB — CBC
HCT: 37.2 % (ref 36.0–46.0)
Hemoglobin: 11.5 g/dL — ABNORMAL LOW (ref 12.0–15.0)
MCH: 24.1 pg — ABNORMAL LOW (ref 26.0–34.0)
MCHC: 30.9 g/dL (ref 30.0–36.0)
MCV: 78 fL — ABNORMAL LOW (ref 80.0–100.0)
Platelets: 376 K/uL (ref 150–400)
RBC: 4.77 MIL/uL (ref 3.87–5.11)
RDW: 15.6 % — ABNORMAL HIGH (ref 11.5–15.5)
WBC: 9.9 K/uL (ref 4.0–10.5)
nRBC: 0 % (ref 0.0–0.2)

## 2024-08-05 LAB — WET PREP, GENITAL
Sperm: NONE SEEN
Trich, Wet Prep: NONE SEEN
WBC, Wet Prep HPF POC: <10 (ref <10)
Yeast Wet Prep HPF POC: NONE SEEN

## 2024-08-05 LAB — COMPREHENSIVE METABOLIC PANEL WITH GFR
ALT: 11 U/L (ref 0–44)
AST: 21 U/L (ref 15–41)
Albumin: 4.1 g/dL (ref 3.5–5.0)
Alkaline Phosphatase: 66 U/L (ref 38–126)
Anion gap: 11 (ref 5–15)
BUN: 12 mg/dL (ref 6–20)
CO2: 23 mmol/L (ref 22–32)
Calcium: 9.1 mg/dL (ref 8.9–10.3)
Chloride: 102 mmol/L (ref 98–111)
Creatinine, Ser: 0.71 mg/dL (ref 0.44–1.00)
GFR, Estimated: >60 mL/min (ref >60)
Glucose, Bld: 83 mg/dL (ref 70–99)
Potassium: 3.8 mmol/L (ref 3.5–5.1)
Sodium: 136 mmol/L (ref 135–145)
Total Bilirubin: 0.3 mg/dL (ref 0.0–1.2)
Total Protein: 7.9 g/dL (ref 6.5–8.1)

## 2024-08-05 LAB — PREGNANCY, URINE: Preg Test, Ur: NEGATIVE

## 2024-08-05 LAB — LIPASE, BLOOD: Lipase: 14 U/L (ref 11–51)

## 2024-08-05 NOTE — ED Triage Notes (Signed)
 RLQ pain radiating to groin area , nausea and intermittent diarrhea x 5 day . Denies urinary symptoms .

## 2024-08-05 NOTE — ED Provider Notes (Signed)
 [place] [facility] AT [facility] Provider Note   CSN: [id number] Arrival date & time: 08/05/24  9155     Patient presents with: Abdominal Pain (RLQ)   Claudia Banks is a 38 y.o. female.   The history is provided by the patient and medical records. No language interpreter was used.  Abdominal Pain    37 year old female presenting with complaint of abdominal pain.  Patient states for the past 5 days she has had intermittent pain to her right lower abdomen.  Pain is sharp achy happen intermittently with occasional bouts of nausea and some loose stool.  Pain seems to be improved with over-the-counter medication such as Tylenol  and ibuprofen .  No report of any fever or chills no chest pain shortness of breath productive cough no urinary discomfort no hematuria no vaginal bleeding or vaginal discharge.  Last menstrual period was 07/08/2024.  Currently rates the pain as 5 out of 10.  Prior to Admission medications   Medication Sig Start Date End Date Taking? Authorizing Provider  cyclobenzaprine  (FLEXERIL ) 10 MG tablet Take 1 tablet (10 mg total) by mouth 2 (two) times daily as needed for muscle spasms. 07/30/17   Ward, Jaime Pilcher, PA-C    Allergies: Patient has no known allergies.    Review of Systems  Gastrointestinal:  Positive for abdominal pain.  All other systems reviewed and are negative.   Updated Vital Signs BP (!) 153/105   Pulse 85   Temp 98.1 F (36.7 C) (Oral)   Resp 16   Wt (!) 138.8 kg   LMP 07/04/2024 (Exact Date)   SpO2 100%   BMI 45.19 kg/m   Physical Exam Vitals and nursing note reviewed.  Constitutional:      General: She is not in acute distress.    Appearance: She is well-developed. She is obese.  HENT:     Head: Atraumatic.  Eyes:     Conjunctiva/sclera: Conjunctivae normal.  Cardiovascular:     Rate and Rhythm: Normal rate and regular rhythm.     Pulses: Normal pulses.     Heart sounds: Normal heart sounds.   Pulmonary:     Effort: Pulmonary effort is normal.     Breath sounds: No wheezing, rhonchi or rales.  Abdominal:     Palpations: Abdomen is soft.     Tenderness: There is abdominal tenderness (Mild tenderness to right side of abdomen on palpation without guarding or rebound tenderness.  Bowel sounds present). There is no right CVA tenderness or left CVA tenderness.  Musculoskeletal:     Cervical back: Neck supple.  Skin:    Findings: No rash.  Neurological:     Mental Status: She is alert.  Psychiatric:        Mood and Affect: Mood normal.     (all labs ordered are listed, but only abnormal results are displayed) Labs Reviewed  LIPASE, BLOOD  COMPREHENSIVE METABOLIC PANEL WITH GFR  CBC  URINALYSIS, ROUTINE W REFLEX MICROSCOPIC  PREGNANCY, URINE    EKG: None  Radiology: No results found.   Procedures   Medications Ordered in the ED - No data to display                                  Medical Decision Making Amount and/or Complexity of Data Reviewed Labs: ordered.   BP (!) 153/105   Pulse 85   Temp 98.1 F (36.7 C) (Oral)  Resp 16   Wt (!) 138.8 kg   LMP 07/04/2024 (Exact Date)   SpO2 100%   BMI 45.19 kg/m   78:82 AM  38 year old female presenting with complaint of abdominal pain.  Patient states for the past 5 days she has had intermittent pain to her right lower abdomen.  Pain is sharp achy happen intermittently with occasional bouts of nausea and some loose stool.  Pain seems to be improved with over-the-counter medication such as Tylenol  and ibuprofen .  No report of any fever or chills no chest pain shortness of breath productive cough no urinary discomfort no hematuria no vaginal bleeding or vaginal discharge.  Last menstrual period was 07/08/2024.  Currently rates the pain as 5 out of 10.  On exam patient is resting comfortably appears to be in no acute discomfort.  She does have some tenderness to her right lower quadrant and right side abdomen  without guarding or rebound tenderness.  -Labs ordered, independently viewed and interpreted by me.  Labs remarkable for reassuring lab values, normal WBC -The patient was maintained on a cardiac monitor.  I personally viewed and interpreted the cardiac monitored which showed an underlying rhythm of: Normal sinus rhythm -Imaging including abdominal pelvis CT scan considered but not performed as labs are reassuring and abdominal exam unremarkable.  Through shared decision making, CT scan was not ordered -This patient presents to the ED for concern of abdominal pain, this involves an extensive number of treatment options, and is a complaint that carries with it a high risk of complications and morbidity.  The differential diagnosis includes appendicitis, colitis, diverticulitis, UTI, kidney stone, ovarian torsion, tubo-ovarian abscess, ectopic pregnancy, PID -Co morbidities that complicate the patient evaluation includes none -Treatment includes reassurance -Reevaluation of the patient after these medicines showed that the patient improved -PCP office notes or outside notes reviewed -Escalation to admission/observation considered: patients feels much better, is comfortable with discharge, and will follow up with PCP -Prescription medication considered, patient comfortable with over-the-counter Tylenol  or ibuprofen  -Social Determinant of Health considered       Final diagnoses:  Right lower quadrant abdominal pain    ED Discharge Orders     None          Nivia Colon, PA-C 08/05/24 1024    Tegeler, Lonni PARAS, MD 08/05/24 1115

## 2024-08-05 NOTE — Discharge Instructions (Signed)
 You have been evaluated for your abdominal discomfort.  Fortunately your blood work today did not show any concerning finding.  You may take over-the-counter Tylenol  or ibuprofen  as needed for aches and pain.  If you develop fever, pain with eating, worsening abdominal pain do not hesitate to return to the ER for further evaluation.

## 2024-08-08 LAB — GC/CHLAMYDIA PROBE AMP (~~LOC~~) NOT AT ARMC
Chlamydia: NEGATIVE
Comment: NEGATIVE
Comment: NORMAL
Neisseria Gonorrhea: NEGATIVE
